# Patient Record
Sex: Male | Born: 1965 | Race: Black or African American | Hispanic: No | Marital: Single | Smoking: Never smoker
Health system: Southern US, Community
[De-identification: ages and names within clinical notes are randomized; demographics above are authoritative.]

## PROBLEM LIST (undated history)

## (undated) ENCOUNTER — Emergency Department (HOSPITAL_COMMUNITY): Discharge status: Absent without leave | Payer: Self-pay

## (undated) DIAGNOSIS — J309 Allergic rhinitis, unspecified: Secondary | ICD-10-CM

## (undated) DIAGNOSIS — R05 Cough: Secondary | ICD-10-CM

## (undated) DIAGNOSIS — I10 Essential (primary) hypertension: Secondary | ICD-10-CM

## (undated) DIAGNOSIS — R053 Chronic cough: Secondary | ICD-10-CM

## (undated) DIAGNOSIS — T7840XA Allergy, unspecified, initial encounter: Secondary | ICD-10-CM

## (undated) DIAGNOSIS — J45909 Unspecified asthma, uncomplicated: Secondary | ICD-10-CM

## (undated) DIAGNOSIS — K5790 Diverticulosis of intestine, part unspecified, without perforation or abscess without bleeding: Secondary | ICD-10-CM

## (undated) HISTORY — DX: Allergy, unspecified, initial encounter: T78.40XA

## (undated) HISTORY — DX: Cough: R05

## (undated) HISTORY — DX: Allergic rhinitis, unspecified: J30.9

## (undated) HISTORY — DX: Unspecified asthma, uncomplicated: J45.909

## (undated) HISTORY — DX: Chronic cough: R05.3

## (undated) HISTORY — PX: EYE SURGERY: SHX253

## (undated) HISTORY — DX: Essential (primary) hypertension: I10

---

## 1998-06-06 ENCOUNTER — Ambulatory Visit (HOSPITAL_COMMUNITY): Admission: RE | Admit: 1998-06-06 | Discharge: 1998-06-06 | Payer: Self-pay | Admitting: Family Medicine

## 2008-01-14 ENCOUNTER — Emergency Department (HOSPITAL_COMMUNITY): Admission: EM | Admit: 2008-01-14 | Discharge: 2008-01-14 | Payer: Self-pay | Admitting: Emergency Medicine

## 2008-11-29 ENCOUNTER — Encounter: Admission: RE | Admit: 2008-11-29 | Discharge: 2008-11-29 | Payer: Self-pay | Admitting: Family Medicine

## 2009-02-27 ENCOUNTER — Encounter: Admission: RE | Admit: 2009-02-27 | Discharge: 2009-02-27 | Payer: Self-pay | Admitting: Family Medicine

## 2010-01-27 DIAGNOSIS — F329 Major depressive disorder, single episode, unspecified: Secondary | ICD-10-CM | POA: Insufficient documentation

## 2010-01-27 DIAGNOSIS — R059 Cough, unspecified: Secondary | ICD-10-CM | POA: Insufficient documentation

## 2010-01-27 DIAGNOSIS — R05 Cough: Secondary | ICD-10-CM | POA: Insufficient documentation

## 2010-01-27 DIAGNOSIS — R079 Chest pain, unspecified: Secondary | ICD-10-CM | POA: Insufficient documentation

## 2010-01-27 DIAGNOSIS — I10 Essential (primary) hypertension: Secondary | ICD-10-CM | POA: Insufficient documentation

## 2010-01-28 ENCOUNTER — Ambulatory Visit: Payer: Self-pay | Admitting: Internal Medicine

## 2010-01-28 LAB — CONVERTED CEMR LAB
Basophils Absolute: 0 10*3/uL (ref 0.0–0.1)
Basophils Relative: 0.6 % (ref 0.0–3.0)
Eosinophils Absolute: 0 10*3/uL (ref 0.0–0.7)
Eosinophils Relative: 0.3 % (ref 0.0–5.0)
HCT: 43.7 % (ref 39.0–52.0)
Hemoglobin: 15.1 g/dL (ref 13.0–17.0)
Lymphocytes Relative: 27.3 % (ref 12.0–46.0)
Lymphs Abs: 2.1 10*3/uL (ref 0.7–4.0)
MCHC: 34.7 g/dL (ref 30.0–36.0)
MCV: 92.7 fL (ref 78.0–100.0)
Monocytes Absolute: 0.4 10*3/uL (ref 0.1–1.0)
Monocytes Relative: 4.8 % (ref 3.0–12.0)
Neutro Abs: 5.2 10*3/uL (ref 1.4–7.7)
Neutrophils Relative %: 67 % (ref 43.0–77.0)
Platelets: 262 10*3/uL (ref 150.0–400.0)
RBC: 4.72 M/uL (ref 4.22–5.81)
RDW: 13.1 % (ref 11.5–14.6)
WBC: 7.8 10*3/uL (ref 4.5–10.5)

## 2010-02-02 LAB — CONVERTED CEMR LAB: IgE (Immunoglobulin E), Serum: 186.1 intl units/mL — ABNORMAL HIGH (ref 0.0–180.0)

## 2010-02-24 ENCOUNTER — Ambulatory Visit: Payer: Self-pay | Admitting: Internal Medicine

## 2010-02-26 ENCOUNTER — Telehealth (INDEPENDENT_AMBULATORY_CARE_PROVIDER_SITE_OTHER): Payer: Self-pay | Admitting: *Deleted

## 2010-02-27 ENCOUNTER — Encounter: Payer: Self-pay | Admitting: Internal Medicine

## 2010-03-19 ENCOUNTER — Ambulatory Visit
Admission: RE | Admit: 2010-03-19 | Discharge: 2010-03-19 | Payer: Self-pay | Source: Home / Self Care | Attending: Internal Medicine | Admitting: Internal Medicine

## 2010-03-20 ENCOUNTER — Ambulatory Visit: Payer: Self-pay | Admitting: Cardiology

## 2010-04-02 ENCOUNTER — Ambulatory Visit
Admission: RE | Admit: 2010-04-02 | Discharge: 2010-04-02 | Payer: Self-pay | Source: Home / Self Care | Attending: Internal Medicine | Admitting: Internal Medicine

## 2010-04-07 ENCOUNTER — Ambulatory Visit (HOSPITAL_COMMUNITY)
Admission: RE | Admit: 2010-04-07 | Discharge: 2010-04-07 | Payer: Self-pay | Source: Home / Self Care | Attending: Internal Medicine | Admitting: Internal Medicine

## 2010-04-07 ENCOUNTER — Encounter: Payer: Self-pay | Admitting: Internal Medicine

## 2010-04-09 ENCOUNTER — Telehealth: Payer: Self-pay | Admitting: Internal Medicine

## 2010-04-09 NOTE — Medication Information (Signed)
Summary: Tax adviser   Imported By: Valinda Hoar 02/27/2010 11:37:06  _____________________________________________________________________  External Attachment:    Type:   Image     Comment:   External Document

## 2010-04-09 NOTE — Progress Notes (Signed)
Summary: PA Nexium > try omeprazole 40 first > ok  Phone Note Other Incoming   Caller: PA Dept [phone] Summary of Call: [facility] [address] [phone]) sent PA for patients Nexium 40mg -qty was in question as well. I spoke with PA dept-further review is needed at this time and should have an answer within 24 hours. Case # [id number] Initial call taken by: Reynaldo Minium CMA,  February 26, 2010 12:10 PM  Follow-up for Phone Call        This has been denied as patient needs to try another PPI first-okay to change to Omeprazole? Thanks.Reynaldo Minium CMA  February 27, 2010 8:47 AM  omeprazole 40 mg Take  one 30-60 min before first meal of the day  Follow-up by: Nyoka Cowden MD,  February 27, 2010 9:27 AM  Additional Follow-up for Phone Call Additional follow up Details #1::        called spoke with patient and advised of MW's recs as stated above.  pt okay with this and verbalized his understanding.  omeprazole sent to verified pharmacy.  pt has an upcoming appt w/ MW 1.19.12 and will keep this. Boone Master CNA/MA  February 27, 2010 9:36 AM     New/Updated Medications: OMEPRAZOLE 40 MG CPDR (OMEPRAZOLE) Take 1 capsule by mouth once a day 30-68minutes before first meal of the day Prescriptions: OMEPRAZOLE 40 MG CPDR (OMEPRAZOLE) Take 1 capsule by mouth once a day 30-62minutes before first meal of the day  #30 x 5   Entered by:   Boone Master CNA/MA   Authorized by:   Nyoka Cowden MD   Signed by:   Boone Master CNA/MA on 02/27/2010   Method used:   Electronically to        [facility] [address] #[id number]* (retail)       [address] [place], [place]  [address]       Ph: [phone]       Fax: [phone]   RxID:   [id number]

## 2010-04-09 NOTE — Assessment & Plan Note (Signed)
Summary: Pulmonary consultation/ cough   Visit Type:  Initial Consult Copy to:  Dr. Tally Joe Primary Provider/Referring Provider:  Dr. Tally Joe  CC:  Cough.  History of Present Illness: 45 [place] with ? EIA outgrew age 45 and never required  treatment  January 28, 2010  1st pulmonary office eval cc new onset  x 6 months indolent onset progressivly worse throat itching assoc with sensation of pnds and eyes watery no better with zyrtec, no better with asthmanex, much better p prednisone for several weeks then recurred, seems worst when lie down , mostly dry minimal mucoid sputum, assoc with mild HB but no overt sinus c/o's.  Pt denies any significant sore throat, dysphagia, itching, sneezing,  nasal congestion or excess secretions,  fever, chills, sweats, unintended wt loss, pleuritic or exertional cp, hempoptysis, change in activity tolerance  orthopnea pnd or leg swelling Pt also denies any obvious fluctuation in symptoms with weather or environmental change or other alleviating or aggravating factors.       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  The patient denies shortness of breath with activity, coughing up blood, chest pain, irregular heartbeats, indigestion, loss of appetite, weight change, difficulty swallowing, sore throat, tooth/dental problems, nasal congestion/difficulty breathing through nose, sneezing, itching, ear ache,  hand/feet swelling, rash, change in color of mucus, and fever.    Vital Signs:  Patient profile:   45 year old male Height:      69 inches Weight:      200.50 pounds BMI:     29.72 O2 Sat:      96 % on Room air Temp:     98.0 degrees F oral Pulse rate:   106 / minute BP sitting:   114 / 88  (right arm)  Vitals Entered By: Vernie Murders (January 28, 2010 10:33 AM)  O2 Flow:  Room air  Physical Exam  Additional Exam:  amb wm with classic voice fatigue and trace pseudowheeze wt 200 January 29, 2010 HEENT: nl dentition, turbinates, and orophanx. Nl external ear canals without cough reflex NECK :  without JVD/Nodes/TM/ nl carotid upstrokes bilaterally LUNGS: no acc muscle use, clear to A and P bilaterally without cough on insp or exp maneuvers CV:  RRR  no s3 or murmur or increase in P2, no edema  ABD:  soft and nontender with nl excursion in the supine position. No bruits or organomegaly, bowel sounds nl MS:  warm without deformities, calf tenderness, cyanosis or clubbing SKIN: warm and dry without lesions   NEURO:  alert, approp, no deficits      White Cell Count          7.8 K/uL                    4.5-10.5   Red Cell Count            4.72 Mil/uL                 4.22-5.81  Hemoglobin                15.1 g/dL                   78.2-95.6   Hematocrit                43.7 %                      39.0-52.0   MCV                       92.7 fl                     78.0-100.0   MCHC                      34.7 g/dL                   21.3-08.6   RDW                       13.1 %                      11.5-14.6   Platelet Count            262.0 K/uL                  150.0-400.0   Neutrophil %              67.0 %                      43.0-77.0   Lymphocyte %              27.3 %                      12.0-46.0   Monocyte %                4.8 %                       3.0-12.0   Eosinophils%              0.3 %                       0.0-5.0   Basophils %               0.6 %                        0.0-3.0   Neutrophill Absolute      5.2 K/uL                    1.4-7.7   Lymphocyte Absolute       2.1 K/uL                    0.7-4.0   Monocyte Absolute         0.4 K/uL                    0.1-1.0  Eosinophils, Absolute                             0.0 K/uL  0.0-0.7   Basophils Absolute        0.0 K/uL                    0.0-0.1   Impression & Recommendations:  Problem # 1:  COUGH (ICD-786.2)  outside films reviewed and are normal IgE 186 profile pending.  Note h/o ? EIA as child and apparent steroid responsiveness now  The most common causes of chronic cough in immunocompetent adults include: upper airway cough syndrome (UACS), previously referred to as postnasal drip syndrome,  caused by variety of rhinosinus conditions; (2) asthma; (3) GERD; (4) chronic bronchitis from cigarette smoking or other inhaled environmental irritants; (5) nonasthmatic eosinophilic bronchitis; and (6) bronchiectasis. These conditions, singly or in combination, have accounted for up to 94% of the causes of chronic cough in prospective studies.   Of the three most common causes of chronic cough, only one GERD can actually cause the other two (asthma and pnds)  and perpetuate the cylce of cough inducing airway trauma, inflammation, heightened sensitivity to reflux which is prompted by the cough itself via a cyclical mechanism.  This may partially respond to steroids and look like asthma and post nasal drainage but never erradicated completely unless the cough and the secondary reflux are eliminated, preferably both at the same time.   If could recurs off suppressants but responds again to prednisone, this limits the ddx to asthma, eos rhinits and bronchitis, and allergic rhinitis/pnds.   The standardized cough guidelines recently published in Chest are a 14 step process, not a single office visit,  and are intended  to address this problem logically,  with an alogrithm dependent on response to each  progressive step  to determine a specific diagnosis with  minimal addtional testing needed. Therefore if compliance is an issue this empiric standardized approach simply won't work.  See instructions for specific recommendations   Medications Added to Medication List This Visit: 1)  Zyrtec Allergy 10 Mg Tabs (Cetirizine hcl) .Marland Kitchen.. 1 once daily 2)  Hydrochlorothiazide 25 Mg Tabs (Hydrochlorothiazide) .Marland Kitchen.. 1 once daily 3)  Nexium 40 Mg Cpdr (Esomeprazole magnesium) .Marland Kitchen.. 1 every am 4)  Nexium 40 Mg Cpdr (Esomeprazole magnesium) .... Take  one 30-60 min before first meal of the day 5)  Pepcid 20 Mg Tabs (Famotidine) .... Take one by mouth at bedtime 6)  Prednisone 10 Mg Tabs (Prednisone) .... 4 each am x 2days, 2x2days, 1x2days and stop  Other Orders: T-Allergy Profile Region [place], [place], [place], [place], [place] (5484) TLB-CBC Platelet - w/Differential (85025-CBCD) Consultation Level V [phone])  Patient Instructions: 1)  stop zyrtec 2)  Change nexium Take  one 30-60 min before first meal of the day and add pepcid 20 mg one at bedtime 3)  Prednisone 4 each am x 2days, 2x2days, 1x2days and stop 4)  if itching sneezing or runny nose,  scratchy throat take chlortrimeton 4 mg every 6 hours as needed 5)  for cough try delsym 2 tsp every 12 hours as needed 6)  GERD (REFLUX)  is a common cause of respiratory symptoms. It commonly presents without heartburn and can be treated with medication, but also with lifestyle changes including avoidance of late meals, excessive alcohol, smoking cessation, and avoid fatty foods, chocolate, peppermint, colas, red wine, and acidic juices such as orange juice. NO MINT OR MENTHOL PRODUCTS SO NO COUGH DROPS  7)  USE SUGARLESS CANDY INSTEAD (jolley ranchers)  8)  NO OIL BASED VITAMINS  9)  Please schedule a  follow-up appointment in 4 weeks, sooner if needed  Prescriptions: PREDNISONE 10 MG  TABS (PREDNISONE) 4 each am x 2days, 2x2days, 1x2days and stop  #14 x 0   Entered and  Authorized by:   Nyoka Cowden MD   Signed by:   Nyoka Cowden MD on 01/28/2010   Method used:   Electronically to        [facility] [address] [facility] [address] #[id number]* (retail)       [address] [place], [place]  [address]       Ph: [phone]       Fax: [phone]   RxID:   [id number]

## 2010-04-09 NOTE — Assessment & Plan Note (Signed)
Summary: Pulmonary/ ext f/u ov for cough flared off Nexium > restart   Copy to:  Dr. Tally Joe Primary Provider/Referring Provider:  Dr. Tally Joe  CC:  Cough- some better.  History of Present Illness: 16 yobm with ? EIA outgrew age 45 and never required  treatment  January 28, 2010  1st pulmonary office eval cc new onset  x 6 months indolent onset progressivly worse throat itching assoc with sensation of pnds and eyes watery no better with zyrtec, no better with asthmanex, much better p prednisone for several weeks then recurred, seems worst when lie down , mostly dry minimal mucoid sputum, assoc with mild HB but no overt sinus c/o's. w/u with pos allergy screen with IgE 186, imp was uacs/pnds  rec stop zyrtec Change nexium Take  one 30-60 min before first meal of the day and add pepcid 20 mg one at bedtime Prednisone 4 each am x 2days, 2x2days, 1x2days and stop if itching sneezing or runny nose,  scratchy throat take chlortrimeton 4 mg every 6 hours as needed for cough try delsym 2 tsp every 12 hours as needed  February 24, 2010 ov cough better on rx then worse once ran out nexium despite continuing hs h2 and h1 per guidelines.  Pt denies any significant sore throat, dysphagia, itching, sneezing,  nasal congestion or excess secretions,  fever, chills, sweats, unintended wt loss, pleuritic or exertional cp, hempoptysis, change in activity tolerance  orthopnea pnd or leg swelling.  Pt also denies any obvious fluctuation in symptoms with weather or environmental change or other alleviating or aggravating factors.            Current Medications (verified): 1)  Hydrochlorothiazide 25 Mg Tabs (Hydrochlorothiazide) .Marland Kitchen.. 1 Once Daily 2)  Nexium 40 Mg Cpdr (Esomeprazole Magnesium) .... Take  One 30-60 Min Before First Meal of The Day- Ran Out 3)  Pepcid 20 Mg Tabs (Famotidine) .... Take One By Mouth At Bedtime  Allergies (verified): No Known Drug Allergies  Past History:  Past  Medical History: Chronic cough ..........................Marland KitchenWert     - Allergy profile Jan 28 2010 IgE 186 pos to grass/mold  Vital Signs:  Patient profile:   45 year old male Weight:      209 pounds O2 Sat:      98 % on Room air Temp:     97.5 degrees F oral Pulse rate:   54 / minute BP sitting:   134 / 82  (right arm) Cuff size:   large  Vitals Entered By: Vernie Murders (February 24, 2010 11:21 AM)  O2 Flow:  Room air  Physical Exam  Additional Exam:  amb wm with classic voice fatigue and freq throat clearing. with mint gum in mouth wt 200 January 29, 2010 209 February 24, 2010  HEENT: nl dentition, turbinates, and orophanx. Nl external ear canals without cough reflex NECK :  without JVD/Nodes/TM/ nl carotid upstrokes bilaterally LUNGS: no acc muscle use, clear to A and P bilaterally without cough on insp or exp maneuvers CV:  RRR  no s3 or murmur or increase in P2, no edema  ABD:  soft and nontender with nl excursion in the supine position. No bruits or organomegaly, bowel sounds nl MS:  warm without deformities, calf tenderness, cyanosis or clubbing       Impression & Recommendations:  Problem # 1:  COUGH (ICD-786.2)   Note h/o ? EIA as child and apparent steroid responsiveness now  The most common causes of chronic  cough in immunocompetent adults include: upper airway cough syndrome (UACS), previously referred to as postnasal drip syndrome,  caused by variety of rhinosinus conditions; (2) asthma; (3) GERD; (4) chronic bronchitis from cigarette smoking or other inhaled environmental irritants; (5) nonasthmatic eosinophilic bronchitis; and (6) bronchiectasis. These conditions, singly or in combination, have accounted for up to 94% of the causes of chronic cough in prospective studies.    since cough  recurred  off suppressants but responds again to prednisone, this limits the ddx to asthma, eos rhinits and bronchitis, and allergic rhinitis/pnds but note he also stopped  nexium prematurely and reflux is to cough what oxygen is to fire  I had an extended discussion with the patient today lasting 15 to 20 minutes of a 25 minute visit on the following issues:   The standardized cough guidelines recently published in Chest are a 14 step process, not a single office visit,  and are intended  to address this problem logically,  with an alogrithm dependent on response to each progressive step  to determine a specific diagnosis with  minimal addtional testing needed. Therefore if compliance is an issue this empiric standardized approach simply won't work.  See instructions for specific recommendations   Orders: Est. Patient Level IV ([address])  Medications Added to Medication List This Visit: 1)  Nexium 40 Mg Cpdr (Esomeprazole magnesium) .... Take  one 30-60 min before first meal of the day- ran out 2)  Nexium 40 Mg Cpdr (Esomeprazole magnesium) .... Take  one 30-60 min before first meal of the day 3)  Prednisone 10 Mg Tabs (Prednisone) .... 4 each am x 2days, 2x2days, 1x2days and stop  Patient Instructions: 1)  GERD (REFLUX)  is a common cause of respiratory symptoms. It commonly presents without heartburn and can be treated with medication, but also with lifestyle changes including avoidance of late meals, excessive alcohol, smoking cessation, and avoid fatty foods, chocolate, peppermint,  or mint in any form, colas, red wine, and acidic juices such as orange juice. NO MINT OR MENTHOL PRODUCTS SO NO COUGH DROPS  2)  USE SUGARLESS CANDY INSTEAD (jolley ranchers)  3)  NO OIL BASED VITAMINS  4)  Resume nexium 40 mg Take  one 30-60 min before first meal of the day  (equivalent of prilosec otc 20 mg x 2) 5)  Prednisone 10 mg 4 each am x 2days, 2x2days, 1x2days and stop  6)  Please schedule a follow-up appointment in 4 weeks, sooner if needed  Prescriptions: PREDNISONE 10 MG  TABS (PREDNISONE) 4 each am x 2days, 2x2days, 1x2days and stop  #14 x 0   Entered and Authorized  by:   Nyoka Cowden MD   Signed by:   Nyoka Cowden MD on 02/24/2010   Method used:   Electronically to        [facility] [address] #[id number]* (retail)       [address] [place], [place]  [address]       Ph: [phone]       Fax: [phone]   RxID:   [id number] NEXIUM 40 MG CPDR (ESOMEPRAZOLE MAGNESIUM) Take  one 30-60 min before first meal of the day  #34 x 11   Entered and Authorized by:   Nyoka Cowden MD   Signed by:   Nyoka Cowden MD on 02/24/2010   Method used:   Electronically to        [facility] [address] #[id number]* (  retail)       [address] [place], [place]  [address]       Ph: [phone]       Fax: [phone]   RxID:   [id number]

## 2010-04-09 NOTE — Assessment & Plan Note (Signed)
Summary: Pulmomonary/ext ov > sinus ct next   Copy to:  Dr. Tally Joe Primary Provider/Referring Provider:  Dr. Tally Joe  CC:  Cough- worse.  History of Present Illness: 51 yobm with ? EIA outgrew age 45 and never required  treatment  January 28, 2010  1st pulmonary office eval cc new onset  x 6 months indolent onset progressivly worse throat itching assoc with sensation of pnds and eyes watery no better with zyrtec, no better with asthmanex, much better p prednisone for several weeks then recurred, seems worst when lie down , mostly dry minimal mucoid sputum, assoc with mild HB but no overt sinus c/o's. w/u with pos allergy screen with IgE 186, imp was uacs/pnds  rec stop zyrtec Change nexium Take  one 30-60 min before first meal of the day and add pepcid 20 mg one at bedtime Prednisone 4 each am x 2days, 2x2days, 1x2days and stop if itching sneezing or runny nose,  scratchy throat take chlortrimeton 4 mg every 6 hours as needed for cough try delsym 2 tsp every 12 hours as needed  February 24, 2010 ov cough better on rx then worse once ran out nexium despite continuing hs h2 and h1 per guidelines.  GERD (REFLUX) diet Resume nexium 40 mg Take  one 30-60 min before first meal of the day  (equivalent of prilosec otc 20 mg x 2) Prednisone 10 mg 4 each am x 2days, 2x2days, 1x2days and stop  No better at all but thinks caught cold over Christmas ended up in UC 03/08/10  with cough yellow  mucus dx of sinus dx treated with amoxicillin and hydromet >  no better  March 19, 2010 ov cough is worse esp when lie down at night despite taking omeprazole 40 mg one in am and pepcid 20 mg one at bedtime, never controlled even with tramadol. no purulent sputum or sob. Pt denies any significant sore throat, dysphagia, itching, sneezing,  nasal congestion or excess secretions,  fever, chills, sweats, unintended wt loss, pleuritic or exertional cp, hempoptysis, change in activity tolerance  orthopnea pnd  or leg swelling. Pt also denies any obvious fluctuation in symptoms with weather or environmental change or other alleviating or aggravating factors.             Current Medications (verified): 1)  Hydrochlorothiazide 25 Mg Tabs (Hydrochlorothiazide) .Marland Kitchen.. 1 Once Daily 2)  Pepcid 20 Mg Tabs (Famotidine) .... Take One By Mouth At Bedtime 3)  Omeprazole 40 Mg Cpdr (Omeprazole) .... Take 1 Capsule By Mouth Once A Day 30-54minutes Before First Meal of The Day 4)  Hydromet 5-1.5 Mg/17ml Syrp (Hydrocodone-Homatropine) .Marland Kitchen.. 1 Tsp Every 4-6 Hrs As Needed  Allergies (verified): No Known Drug Allergies  Past History:  Past Medical History: Chronic cough ..........................Marland KitchenWert     - Allergy profile Jan 28 2010 IgE 186 pos to grass/mold only     - Sinus CT March 19, 2010 >>>  Vital Signs:  Patient profile:   45 year old male Weight:      212 pounds O2 Sat:      99 % on Room air Temp:     98.5 degrees F oral Pulse rate:   60 / minute BP sitting:   110 / 80  (left arm) Cuff size:   large  Vitals Entered By: Vernie Murders (March 19, 2010 9:53 AM)  O2 Flow:  Room air  Physical Exam  Additional Exam:  amb wm with somewhat of a hopeless affect  wt  200 January 29, 2010 >  209 February 24, 2010 > 212 March 19, 2010  HEENT: nl dentition, turbinates, and orophanx. Nl external ear canals without cough reflex NECK :  without JVD/Nodes/TM/ nl carotid upstrokes bilaterally LUNGS: no acc muscle use, clear to A and P bilaterally without cough on insp or exp maneuvers CV:  RRR  no s3 or murmur or increase in P2, no edema  ABD:  soft and nontender with nl excursion in the supine position. No bruits or organomegaly, bowel sounds nl MS:  warm without deformities, calf tenderness, cyanosis or clubbing       Impression & Recommendations:  Problem # 1:  COUGH (ICD-786.2)  Note h/o ? EIA as child and apparent partial steroid responsiveness now  The most common causes of chronic  cough in immunocompetent adults include: upper airway cough syndrome (UACS), previously referred to as postnasal drip syndrome,  caused by variety of rhinosinus conditions; (2) asthma; (3) GERD; (4) chronic bronchitis from cigarette smoking or other inhaled environmental irritants; (5) nonasthmatic eosinophilic bronchitis; and (6) bronchiectasis. These conditions, singly or in combination, have accounted for up to 94% of the causes of chronic cough in prospective studies.   Of the three most common causes of chronic cough, only one can (GERD) actually aggravate or enhance  the other two and perpetuate the cylce of cough inducing airway trauma, inflammation, heightened sensitivity to reflux which is prompted by the cough itself via a cyclical mechanism.  This may partially respond to steroids and look like asthma and post nasal drainage but never erradicated completely unless the cough and the secondary reflux are eliminated, preferably both at the same time.   See instructions for specific recommendations - add 1st gen H1 per guidelines and check sinus ct .   Each maintenance medication was reviewed in detail including most importantly the difference between maintenance and as needed and under what circumstances the prns are to be used. need to use a trust but verify approach to make sure he is using meds correctly before starting on additional empiric trials. See instructions for specific recommendations   Medications Added to Medication List This Visit: 1)  Hydromet 5-1.5 Mg/22ml Syrp (Hydrocodone-homatropine) .Marland Kitchen.. 1 tsp every 4-6 hrs as needed 2)  Tramadol Hcl 50 Mg Tabs (Tramadol hcl) .... One to two by mouth every 4-6 hours as needed for coughing 3)  Prednisone 10 Mg Tabs (Prednisone) .... 4 each am x 2 days,  3 x 2days, 2x2 days, and 1x2 days  Other Orders: Misc. Referral (Misc. Ref) Est. Patient Level IV ([id number])  Patient Instructions: 1)  GERD (REFLUX)  is a common cause of respiratory  symptoms. It commonly presents without heartburn and can be treated with medication, but also with lifestyle changes including avoidance of late meals, excessive alcohol, smoking cessation, and avoid fatty foods, chocolate, peppermint, colas, red wine, and acidic juices such as orange juice. NO MINT OR MENTHOL PRODUCTS SO NO COUGH DROPS  2)  USE SUGARLESS CANDY INSTEAD (jolley ranchers)  3)  NO OIL BASED VITAMINS  4)  Prednisone 4 each am x 2 days,  3 x 2days, 2x2 days, and 1x2 days 5)  Take delsym two tsp every 12 hours and add tramadol 50 mg up to every 4 hours to suppress the urge to cough. Swallowing water or using ice chips/non mint and menthol containing candies (such as lifesavers or sugarless jolly ranchers) are also effective.  6)  Chlortrimeton 4 mg one at bedtime and as  needed during the day for itching scratchy throat. 7)  See Patient Care Coordinator before leaving for sinus CT 8)  Please schedule a follow-up appointment in 2 weeks, sooner if needed with all your active  medcations in hand  Prescriptions: PREDNISONE 10 MG  TABS (PREDNISONE) 4 each am x 2 days,  3 x 2days, 2x2 days, and 1x2 days  #20 x 0   Entered and Authorized by:   Nyoka Cowden MD   Signed by:   Nyoka Cowden MD on 03/19/2010   Method used:   Electronically to        [facility] [address] [id number]* (retail)       [address] [place], [place]  [address]       Ph: [phone]       Fax: [phone]   RxID:   [id number] TRAMADOL HCL 50 MG  TABS (TRAMADOL HCL) One to two by mouth every 4-6 hours as needed for coughing  #40 x 0   Entered and Authorized by:   Nyoka Cowden MD   Signed by:   Nyoka Cowden MD on 03/19/2010   Method used:   Electronically to        [facility] [address] [id number]* (retail)       [address] [place], [place]  [address]       Ph: [phone]       Fax: [phone]   RxID:   [id number]

## 2010-04-09 NOTE — Assessment & Plan Note (Signed)
Summary: Pulmonary/ ext summary ov, better  still some cough > MCT next   Copy to:  Dr. Tally Joe Primary Provider/Referring Provider:  Dr. Tally Joe  CC:  2 wk. f/u-cough-better, prod. yellow in am, clear in pm, and no sob or wheezing.  History of Present Illness: 45 yobm with ? EIA outgrew age 45 and never required  treatment  January 28, 2010  1st pulmonary office eval cc new onset  x 6 months indolent onset progressivly worse throat itching assoc with sensation of pnds and eyes watery no better with zyrtec, no better with asthmanex, much better p prednisone for several weeks then recurred, seems worst when lie down , mostly dry minimal mucoid sputum, assoc with mild HB but no overt sinus c/o's. w/u with pos allergy screen with IgE 186, imp was uacs/pnds  rec stop zyrtec Change nexium Take  one 30-60 min before first meal of the day and add pepcid 20 mg one at bedtime Prednisone 4 each am x 2days, 2x2days, 1x2days and stop if itching sneezing or runny nose,  scratchy throat take chlortrimeton 4 mg every 6 hours as needed for cough try delsym 2 tsp every 12 hours as needed  February 24, 2010 ov cough better on rx then worse once ran out nexium despite continuing hs h2 and h1 per guidelines.  GERD (REFLUX) diet Resume nexium 40 mg Take  one 30-60 min before first meal of the day  (equivalent of prilosec otc 20 mg x 2) Prednisone 10 mg 4 each am x 2days, 2x2days, 1x2days and stop  No better at all but thinks caught cold over Christmas ended up in UC 03/08/10  with cough yellow  mucus dx of sinus dx treated with amoxicillin and hydromet >  no better  March 19, 2010 ov cough is worse esp when lie down at night despite taking omeprazole 40 mg one in am and pepcid 20 mg one at bedtime, never controlled even with tramadol. rec GERD (REFLUX) diet  Prednisone 4 each am x 2 days,  3 x 2days, 2x2 days, and 1x2 days Take delsym two tsp every 12 hours and add tramadol 50 mg up to every 4 hours  to suppress the urge to cough. Swallowing water or using ice chips/non mint and menthol containing candies (such as lifesavers or sugarless jolly ranchers) are also effective.  Chlortrimeton 4 mg one at bedtime and as needed during the day for itching scratchy throat. Sinus ct > neg    see page 2     April 02, 2010 2 wk. f/u-cough-better,prod. yellow in am,clear in pm,no sob or wheezing, no noct disturbance, no longer using tramadol, did not bring in the pepcid but has everything else in hand including chlortrimeton, had not taken by hour of ov and freq throat clearing noted during the interview with some sniffling.  Pt denies any significant sore throat, dysphagia, itching, sneezing,  nasal congestion or excess secretions,  fever, chills, sweats, unintended wt loss, pleuritic or exertional cp, hempoptysis, change in activity tolerance  orthopnea pnd or leg swelling Pt also denies any obvious fluctuation in symptoms with weather or environmental change or other alleviating or aggravating factors.       Preventive Screening-Counseling & Management  Alcohol-Tobacco     Smoking Status: never  Current Medications (verified): 1)  Hydrochlorothiazide 25 Mg Tabs (Hydrochlorothiazide) .Marland Kitchen.. 1 Once Daily 2)  Pepcid 20 Mg Tabs (Famotidine) .... Take One By Mouth At Bedtime 3)  Omeprazole 40 Mg Cpdr (Omeprazole) .Marland KitchenMarland KitchenMarland Kitchen  Take 1 Capsule By Mouth Once A Day 30-56minutes Before First Meal of The Day 4)  Tramadol Hcl 50 Mg  Tabs (Tramadol Hcl) .... 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 ETOH occ  Risk  Factors: Smoking Status: never (04/02/2010)  Past Medical History: Chronic cough ..........................Marland KitchenWert     - Allergy profile Jan 28 2010 IgE 186 pos to grass/mold only     - Sinus CT March 19, 2010 >>> neg     - Methacholine challenge test scheduled 04/02/10>>>  Family History: Heart dz- Father and Mother (both had stroke) DVT- Father Sarcoid- Mother Negative for respiratory diseases or atopy  Brother and father have issues with freq sniffling/ coughing  Social History: Smoking Status:  never  Vital Signs:  Patient profile:   45 year old male Height:      69 inches Weight:      209.25 pounds O2 Sat:      96 % Temp:     98.3 degrees F oral Pulse rate:   80 / minute BP sitting:   122 / 78  (left arm) Cuff size:   large  Vitals Entered By: Arlyce Dice  CC: 2 wk. f/u-cough-better,prod. yellow in am,clear in pm,no sob or wheezing Is Patient Diabetic? No Comments Medications reviewed wi th patient ,phone # verfied. Vernie Murders  April 02, 2010 9:06 AM    Physical Exam  Additional Exam:  amb wm with affect brighter, still freq throat clearing wt 200 January 29, 2010 >  209 February 24, 2010 > 212 March 19, 2010 > 209 April 04, 2010  HEENT: nl dentition, turbinates, and orophanx is pristine. Nl external ear canals without cough reflex NECK :  without JVD/Nodes/TM/ nl carotid upstrokes bilaterally LUNGS: no acc muscle use, clear to A and P bilaterally without cough on insp or exp maneuvers CV:  RRR  no s3 or murmur or increase in P2, no edema  ABD:  soft and nontender with nl excursion in the supine position. No bruits or organomegaly, bowel sounds nl MS:  warm without deformities, calf tenderness, cyanosis or clubbing       Impression & Recommendations:  Problem # 1:  COUGH (ICD-786.2)   Note h/o ? EIA as child and apparent partial steroid responsiveness now > methacholine challenge next  The most common causes of chronic cough in  immunocompetent adults include: upper airway cough syndrome (UACS), previously referred to as postnasal drip syndrome,  caused by variety of rhinosinus conditions; (2) asthma; (3) GERD; (4) chronic bronchitis from cigarette smoking or other inhaled environmental irritants; (5) nonasthmatic eosinophilic bronchitis; and (6) bronchiectasis. These conditions, singly or in combination, have accounted for up to 94% of the causes of chronic cough in prospective studies.   Of the three most common causes of chronic cough, only one can (GERD) actually aggravate or enhance  the other two and perpetuate the cylce of cough inducing airway trauma, inflammation, heightened sensitivity to reflux which is prompted by the cough itself via a cyclical mechanism.  This may partially respond to steroids and look like asthma and post nasal drainage but never erradicated completely unless the cough and  the secondary reflux are eliminated, preferably both at the same time.   See instructions for specific recommendations - add 1st gen H1 per guidelines  Each maintenance medication was reviewed in detail including most importantly the difference between maintenance and as needed and under what circumstances the prns are to be used. need to use a trust but verify approach to make sure he is using meds correctly before starting on additional empiric trials. See instructions for specific recommendations   Other Orders: Misc. Referral (Misc. Ref) Est. Patient Level III (56213)  Patient Instructions: 1)  See Patient Care Coordinator before leaving for methacholine challenge test 2)  Take delsym two tsp every 12 hours and add tramadol 50 mg up to every 4 hours if needed  to suppress the urge to cough. Swallowing water or using ice chips/non mint and menthol containing candies (such as lifesavers or sugarless jolly ranchers) are also effective.  3)  Always bring all active medications with you to the office    Immunization  History:  Influenza Immunization History:    Influenza:  historical (12/06/2009)

## 2010-04-10 ENCOUNTER — Telehealth: Payer: Self-pay | Admitting: Internal Medicine

## 2010-04-15 NOTE — Progress Notes (Signed)
Summary: returned call-no record of anyone calling  Phone Note Call from Patient Call back at Home Phone [phone]   Caller: Patient Call For: Sherran Margolis Summary of Call: pt returned call. (he says the nurse may have called him re: recent meth.challenge test.  Initial call taken by: Tivis Ringer, CNA,  April 09, 2010 9:32 AM  Follow-up for Phone Call        Advised pt no record of anyone calling from this office. I advised it takes 2 weeks to get MCT results and that we will call once they are reviewed. Carron Curie CMA  April 09, 2010 10:07 AM

## 2010-04-17 ENCOUNTER — Encounter: Payer: Self-pay | Admitting: Internal Medicine

## 2010-04-17 ENCOUNTER — Ambulatory Visit (INDEPENDENT_AMBULATORY_CARE_PROVIDER_SITE_OTHER): Payer: 59 | Admitting: Internal Medicine

## 2010-04-17 DIAGNOSIS — R059 Cough, unspecified: Secondary | ICD-10-CM

## 2010-04-17 DIAGNOSIS — R05 Cough: Secondary | ICD-10-CM

## 2010-04-23 NOTE — Assessment & Plan Note (Signed)
Summary: Pulmonary/ ext ov for cough variant asthma/ start singulair   Copy to:  Dr. Tally Joe Primary Provider/Referring Provider:  Dr. Tally Joe  CC:  Cough- improved some.  History of Present Illness: 44 yobm with ? EIA outgrew age 45 and never required  treatment but cough since 07/2009 with POSITIVE methacholine challenge 03/2010 c/w cough variant asthma.  January 28, 2010  1st pulmonary office eval cc cough = new onset  x 6 months indolent onset progressivly worse throat itching assoc with sensation of pnds and eyes watery no better with zyrtec, no better with asthmanex, much better p prednisone for several weeks then recurred, seems worst when lie down , mostly dry minimal mucoid sputum, assoc with mild HB but no overt sinus c/o's. w/u with pos allergy screen with IgE 186 grass/ mold only  imp was uacs/pnds  rec stop zyrtec Change nexium Take  one 30-60 min before first meal of the day and add pepcid 20 mg one at bedtime Prednisone 4 each am x 2days, 2x2days, 1x2days and stop if itching sneezing or runny nose,  scratchy throat take chlortrimeton 4 mg every 6 hours as needed for cough try delsym 2 tsp every 12 hours as needed  February 24, 2010 ov cough better on rx then worse once ran out nexium despite continuing hs h2 and h1 per guidelines.  GERD (REFLUX) diet Resume nexium 40 mg Take  one 30-60 min before first meal of the day  (equivalent of prilosec otc 20 mg x 2) Prednisone 10 mg 4 each am x 2days, 2x2days, 1x2days and stop  No better at all but thinks caught cold over Christmas ended up in UC 03/08/10  with cough yellow  mucus dx of sinus dx treated with amoxicillin and hydromet >  no better  March 19, 2010 ov cough is worse esp when lie down at night despite taking omeprazole 40 mg one in am and pepcid 20 mg one at bedtime, never controlled even with tramadol. rec GERD (REFLUX) diet  Prednisone 4 each am x 2 days,  3 x 2days, 2x2 days, and 1x2 days Take delsym two tsp  every 12 hours and add tramadol 50 mg up to every 4 hours to suppress the urge to cough.  Sinus ct > neg    see page 2      April 02, 2010 2 wk. f/u-cough-better,prod. yellow in am,clear in pm,no sob or wheezing, no noct disturbance, no longer using tramadol, did not bring in the pepcid but has everything else in hand including chlortrimeton, had not taken by hour of ov and freq throat clearing noted during the interview with some sniffling.  See Patient Care Coordinator before leaving for methacholine challenge test Take delsym two tsp every 12 hours and add tramadol 50 mg up to every 4 hours if needed  to suppress the urge to cough. Swallowing water or using ice chips/non mint and menthol containing candies (such as lifesavers or sugarless jolly ranchers) are also effective.  Always bring all active medications with you to the office   1/31/ 2012 methacholine POSITIVE  at second dose but no symptoms of cough/ tightness  April 17, 2010 ov better to his satisfaction but still gets tickle in throat requiring chortrimeton and tramdol daily. no excess mucus, no sob. Pt denies any significant sore throat, dysphagia, itching, sneezing,  nasal congestion or excess secretions,  fever, chills, sweats, unintended wt loss, pleuritic or exertional cp, hempoptysis, change in activity tolerance  orthopnea pnd  or leg swelling Pt also denies any obvious fluctuation in symptoms with weather or environmental change or other alleviating or aggravating factors.        Current Medications (verified): 1)  Hydrochlorothiazide 25 Mg Tabs (Hydrochlorothiazide) .Marland Kitchen.. 1 Once Daily 2)  Pepcid 20 Mg Tabs (Famotidine) .... Take One By Mouth At Bedtime 3)  Omeprazole 40 Mg Cpdr (Omeprazole) .... Take 1 Capsule By Mouth Once A Day 30-55minutes Before First Meal of The Day 4)  Tramadol Hcl 50 Mg  Tabs (Tramadol Hcl) .... One To Two By Mouth Every 4-6 Hours As Needed For Coughing 5)  Chlorpheniramine Maleate 4 Mg Tabs  (Chlorpheniramine Maleate) .Marland Kitchen.. 1 At Bedtime As Needed and Every 4 Hrs During The Day As Needed  Allergies (verified): No Known Drug Allergies  Past History:  Past Medical History: Chronic cough ..........................Marland KitchenWert     - Allergy profile Jan 28 2010 IgE 186 pos to grass/mold only     - Sinus CT March 19, 2010 >>> neg     - Methacholine challenge test  04/02/10> POSITIVE at 2nd dose but no symptoms  Vital Signs:  Patient profile:   45 year old male Weight:      214 pounds O2 Sat:      98 % on Room air Temp:     97.6 degrees F oral Pulse rate:   80 / minute BP sitting:   112 / 78  (left arm) Cuff size:   large  Vitals Entered By: Vernie Murders (April 17, 2010 2:49 PM)  O2 Flow:  Room air  Physical Exam  Additional Exam:  amb wm with affect brighter, still occ  throat clearing wt 200 January 29, 2010 > 209 April 04, 2010 > 214 April 17, 2010  HEENT: nl dentition, turbinates, and orophanx is pristine. Nl external ear canals without cough reflex NECK :  without JVD/Nodes/TM/ nl carotid upstrokes bilaterally LUNGS: no acc muscle use, clear to A and P bilaterally without cough on insp or exp maneuvers CV:  RRR  no s3 or murmur or increase in P2, no edema  ABD:  soft and nontender with nl excursion in the supine position. No bruits or organomegaly, bowel sounds nl MS:  warm without deformities, calf tenderness, cyanosis or clubbing       Impression & Recommendations:  Problem # 1:  COUGH (ICD-786.2) Note h/o ? EIA as child and apparent partial steroid responsiveness now > methacholine challenge POSITIVE   The most common causes of chronic cough in immunocompetent adults include: upper airway cough syndrome (UACS), previously referred to as postnasal drip syndrome,  caused by variety of rhinosinus conditions; (2) asthma; (3) GERD; (4) chronic bronchitis from cigarette smoking or other inhaled environmental irritants; (5) nonasthmatic eosinophilic bronchitis;  and (6) bronchiectasis. These conditions, singly or in combination, have accounted for up to 94% of the causes of chronic cough in prospective studies.    Most likely this is cough variant asthma, though note no correlation between drop in fev1 on MCT vs symptoms which continue to be largely upper resp > try singulair first, then consider qvar  Explained many pts with cough have more than one trigger(and note no cough provoked on MCT)  so no change in other meds until cough 100% controlled  Medications Added to Medication List This Visit: 1)  Chlorpheniramine Maleate 4 Mg Tabs (Chlorpheniramine maleate) .Marland Kitchen.. 1 at bedtime as needed and every 4 hrs during the day as needed 2)  Singulair 10 Mg Tabs (Montelukast  sodium) .... One by mouth each pm  Other Orders: Est. Patient Level IV (16109)  Patient Instructions: 1)  Singulair 10 mg one each  and every pm to see if cough / drainage  resolves but continue all the other medications the same 2)  Please schedule a follow-up appointment in 4 weeks, sooner if needed

## 2010-04-23 NOTE — Progress Notes (Signed)
Summary: positive MCT- LMTCB x 2  Phone Note Outgoing Call   Call placed by: Vernie Murders,  April 10, 2010 10:30 AM Call placed to: Patient Summary of Call: Per MW, MCT was positive for asthma, so pt needs appt to discuss further options. Called pt and LMOMTCB. Initial call taken by: Vernie Murders,  April 10, 2010 10:31 AM  Follow-up for Phone Call        [facility] Vernie Murders  April 14, 2010 4:15 PM  Spoke wit pt and notified of results/recs per MW.  Pt verbalized understanding and appt sched for 04/17/10 at 2:45 pm. Follow-up by: Vernie Murders,  April 14, 2010 5:16 PM

## 2010-05-15 ENCOUNTER — Ambulatory Visit (INDEPENDENT_AMBULATORY_CARE_PROVIDER_SITE_OTHER): Payer: 59 | Admitting: Internal Medicine

## 2010-05-15 ENCOUNTER — Encounter: Payer: Self-pay | Admitting: Internal Medicine

## 2010-05-15 DIAGNOSIS — J45909 Unspecified asthma, uncomplicated: Secondary | ICD-10-CM

## 2010-05-15 DIAGNOSIS — R059 Cough, unspecified: Secondary | ICD-10-CM

## 2010-05-15 DIAGNOSIS — R05 Cough: Secondary | ICD-10-CM

## 2010-05-26 NOTE — Assessment & Plan Note (Signed)
Summary: Pulmonary  ext f/u ov failed singulair, try qvar 50% hfa   Copy to:  Dr. Tally Joe Primary Provider/Referring Provider:  Dr. Tally Joe  CC:  Cough- worse at hs.  History of Present Illness: 40 yobm with ? EIA outgrew age 45 and never required  treatment but cough since 07/2009 with POSITIVE methacholine challenge 03/2010 c/w cough variant asthma.  January 28, 2010  1st pulmonary office eval cc cough = new onset  x 6 months indolent onset progressivly worse throat itching assoc with sensation of pnds and eyes watery no better with zyrtec, no better with asthmanex, much better p prednisone for several weeks then recurred, seems worst when lie down , mostly dry minimal mucoid sputum, assoc with mild HB but no overt sinus c/o's. w/u with pos allergy screen with IgE 186 grass/ mold only  imp was uacs/pnds  rec stop zyrtec Change nexium Take  one 30-60 min before first meal of the day and add pepcid 20 mg one at bedtime Prednisone 4 each am x 2days, 2x2days, 1x2days and stop if itching sneezing or runny nose,  scratchy throat take chlortrimeton 4 mg every 6 hours as needed for cough try delsym 2 tsp every 12 hours as needed  February 24, 2010 ov cough better on rx then worse once ran out nexium despite continuing hs h2 and h1 per guidelines.  GERD (REFLUX) diet Resume nexium 40 mg Take  one 30-60 min before first meal of the day  (equivalent of prilosec otc 20 mg x 2) Prednisone 10 mg 4 each am x 2days, 2x2days, 1x2days and stop  No better at all but thinks caught cold over Christmas ended up in UC 03/08/10  with cough yellow  mucus dx of sinus dx treated with amoxicillin and hydromet >  no better  March 19, 2010 ov cough is worse esp when lie down at night despite taking omeprazole 40 mg one in am and pepcid 20 mg one at bedtime, never controlled even with tramadol. rec GERD (REFLUX) diet  Prednisone 4 each am x 2 days,  3 x 2days, 2x2 days, and 1x2 days Take delsym two tsp every  12 hours and add tramadol 50 mg up to every 4 hours to suppress the urge to cough.  Sinus ct > neg    see page 2      April 02, 2010 2 wk. f/u-cough-better,prod. yellow in am,clear in pm,no sob or wheezing, no noct disturbance, no longer using tramadol, did not bring in the pepcid but has everything else in hand including chlortrimeton, had not taken by hour of ov and freq throat clearing noted during the interview with some sniffling.  See Patient Care Coordinator before leaving for methacholine challenge test Take delsym two tsp every 12 hours and add tramadol 50 mg up to every 4 hours if needed  to suppress the urge to cough. Swallowing water or using ice chips/non mint and menthol containing candies (such as lifesavers or sugarless jolly ranchers) are also effective.  Always bring all active medications with you to the office   1/31/ 2012 methacholine POSITIVE  at second dose but no symptoms of cough/ tightness  April 17, 2010 ov better to his satisfaction but still gets tickle in throat requiring chortrimeton and tramdol daily. no excess mucus, no sob. rec Singulair 10 mg one each  and every pm to see if cough / drainage  resolves but continue all the other medications the same  May 15, 2010  ov  cc cough  no better with singulair esp at hs.  pnds better with h1 but not helping cough.  no sign sob.  Pt denies any significant sore throat, dysphagia, itching, sneezing,  nasal congestion or excess secretions,  fever, chills, sweats, unintended wt loss, pleuritic or exertional cp, hempoptysis, change in activity tolerance  orthopnea pnd or leg swelling Pt also denies any obvious fluctuation in symptoms with weather or environmental change or other alleviating or aggravating factors.       Current Medications (verified): 1)  Hydrochlorothiazide 25 Mg Tabs (Hydrochlorothiazide) .Marland Kitchen.. 1 Once Daily 2)  Pepcid 20 Mg Tabs (Famotidine) .... Take One By Mouth At Bedtime 3)  Omeprazole 40 Mg  Cpdr (Omeprazole) .... Take 1 Capsule By Mouth Once A Day 30-41minutes Before First Meal of The Day 4)  Tramadol Hcl 50 Mg  Tabs (Tramadol Hcl) .... One To Two By Mouth Every 4-6 Hours As Needed For Coughing 5)  Chlorpheniramine Maleate 4 Mg Tabs (Chlorpheniramine Maleate) .Marland Kitchen.. 1 At Bedtime As Needed and Every 4 Hrs During The Day As Needed 6)  Singulair 10 Mg  Tabs (Montelukast Sodium) .... One By Mouth Each Pm  Allergies (verified): No Known Drug Allergies  Past History:  Past Medical History: Chronic cough ..........................Marland KitchenWert     - Allergy profile Jan 28 2010 IgE 186 pos to grass/mold only     - Sinus CT March 19, 2010 >>> neg     - Methacholine challenge test  04/02/10  >  POSITIVE at 2nd dose but no symptoms    -  Singulair stopped May 15, 2010 not effective    - Qvar 40 started May 15, 2010 >>  Vital Signs:  Patient profile:   45 year old male Weight:      212 pounds O2 Sat:      97 % on Room air Temp:     98.1 degrees F oral Pulse rate:   77 / minute BP sitting:   122 / 78  (left arm) Cuff size:   large  Vitals Entered By: Vernie Murders (May 15, 2010 12:15 PM)  O2 Flow:  Room air  Physical Exam  Additional Exam:  amb wm with affect brighter, still freq  throat clearing wt 200 January 29, 2010 > 209 April 04, 2010 > 214 April 17, 2010 > 212 May 17, 2010  HEENT: nl dentition, turbinates, and orophanx is pristine. Nl external ear canals without cough reflex NECK :  without JVD/Nodes/TM/ nl carotid upstrokes bilaterally LUNGS: no acc muscle use, clear to A and P bilaterally without cough on insp or exp maneuvers CV:  RRR  no s3 or murmur or increase in P2, no edema  ABD:  soft and nontender with nl excursion in the supine position. No bruits or organomegaly, bowel sounds nl MS:  warm without deformities, calf tenderness, cyanosis or clubbing       Impression & Recommendations:  Problem # 1:  ASTHMA, UNSPECIFIED (ICD-493.90) Dx with pos  MCT,  no response to singulair so next step is add qvar  I spent extra time with the patient today explaining optimal mdi  technique.  This improved from  25-50% but still clearly needs work  Problem # 2:  COUGH (ICD-786.2)  May not be directly related to asthma and more c/w   Classic Upper airway cough syndrome, so named because it's frequently impossible to sort out how much is  CR/sinusitis with freq throat clearing (which can  be related to primary GERD)   vs  causing  secondary extra esophageal GERD from wide swings in gastric pressure that occur with throat clearing, promoting self use of mint and menthol lozenges that reduce the lower esophageal sphincter tone and exacerbate the problem further These are the same pts who not infrequently have failed to tolerate ace inhibitors,  dry powder inhalers or biphosphonates or report having reflux symptoms that don't respond to standard doses of PPI   Qvar may actually make this component worse but less so than all the other ics so worth a try.  In meantime emphasize continued rx of gerd and CR. See instructions for specific recommendations   Orders: Est. Patient Level IV ([address])  Medications Added to Medication List This Visit: 1)  Qvar 40 Mcg/act Aers (Beclomethasone dipropionate) .... 2 puffs first thing  in am and 2 puffs again in pm about 12 hours later 2)  Prednisone 10 Mg Tabs (Prednisone) .... 4 each am x 2days, 2x2days, 1x2days and stop  Patient Instructions: 1)  Stop singulair 2)  Qvar 40 2 puffs first thing  in am and 2 puffs again in pm about 12 hours later - if you like it fill the presciption in 25 days 3)  Prednisone 10 mg 4 each am x 2days, 2x2days, 1x2days and stop 4)   use enough tramadol to eliminate the cough Prescriptions: PREDNISONE 10 MG  TABS (PREDNISONE) 4 each am x 2days, 2x2days, 1x2days and stop  #14 x 0   Entered and Authorized by:   Nyoka Cowden MD   Signed by:   Nyoka Cowden MD on 05/15/2010   Method used:    Electronically to        [facility] [address] [id number]* (retail)       [address] [place], [place]  [address]       Ph: [phone]       Fax: [phone]   RxID:   [id number] QVAR 40 MCG/ACT  AERS (BECLOMETHASONE DIPROPIONATE) 2 puffs first thing  in am and 2 puffs again in pm about 12 hours later  #1 x 11   Entered and Authorized by:   Nyoka Cowden MD   Signed by:   Nyoka Cowden MD on 05/15/2010   Method used:   Print then Give to Patient   RxID:   [id number]

## 2010-06-01 ENCOUNTER — Encounter: Payer: Self-pay | Admitting: Internal Medicine

## 2010-06-04 ENCOUNTER — Encounter: Payer: Self-pay | Admitting: Internal Medicine

## 2010-06-05 ENCOUNTER — Ambulatory Visit: Payer: 59 | Admitting: Internal Medicine

## 2010-06-08 ENCOUNTER — Encounter: Payer: Self-pay | Admitting: Internal Medicine

## 2010-06-08 ENCOUNTER — Ambulatory Visit (INDEPENDENT_AMBULATORY_CARE_PROVIDER_SITE_OTHER): Payer: 59 | Admitting: Internal Medicine

## 2010-06-08 VITALS — BP 118/80 | HR 63 | Temp 98.5°F | Ht 69.0 in | Wt 213.6 lb

## 2010-06-08 DIAGNOSIS — R059 Cough, unspecified: Secondary | ICD-10-CM

## 2010-06-08 DIAGNOSIS — R05 Cough: Secondary | ICD-10-CM

## 2010-06-08 DIAGNOSIS — J45909 Unspecified asthma, uncomplicated: Secondary | ICD-10-CM

## 2010-06-08 MED ORDER — PREDNISONE (PAK) 10 MG PO TABS: ORAL_TABLET | ORAL | Status: AC

## 2010-06-08 MED ORDER — BECLOMETHASONE DIPROPIONATE 40 MCG/ACT IN AERS: 2.0000 | INHALATION_SPRAY | Freq: Two times a day (BID) | RESPIRATORY_TRACT | Status: DC

## 2010-06-08 NOTE — Progress Notes (Signed)
Subjective:    Patient ID: Alexander Mclean, male    DOB: 09-08-1965, 45 y.o.   MRN: [id number]  HPI  71 yobm with ? EIA outgrew age 27 and never required treatment but cough since 07/2009 with POSITIVE methacholine challenge 03/2010 c/w cough variant asthma.   January 28, 2010 1st pulmonary office eval cc cough = new onset x 6 months indolent onset progressivly worse throat itching assoc with sensation of pnds and eyes watery no better with zyrtec, no better with asthmanex, much better p prednisone for several weeks then recurred, seems worst when lie down , mostly dry minimal mucoid sputum, assoc with mild HB but no overt sinus c/o's.  w/u with pos allergy screen with IgE 186 grass/ mold only  imp was uacs/pnds  rec stop zyrtec  Change nexium Take one 30-60 min before first meal of the day and add pepcid 20 mg one at bedtime  Prednisone 4 each am x 2days, 2x2days, 1x2days and stop  if itching sneezing or runny nose, scratchy throat take chlortrimeton 4 mg every 6 hours as needed  for cough try delsym 2 tsp every 12 hours as needed   February 24, 2010 ov cough better on rx then worse once ran out nexium despite continuing hs h2 and h1 per guidelines. GERD (REFLUX) diet  Resume nexium 40 mg Take one 30-60 min before first meal of the day (equivalent of prilosec otc 20 mg x 2)  Prednisone 10 mg 4 each am x 2days, 2x2days, 1x2days and stop  No better at all but thinks caught cold over Christmas ended up in UC 03/08/10 with cough yellow mucus dx of sinus dx treated with amoxicillin and hydromet > no better   March 19, 2010 ov cough is worse esp when lie down at night despite taking omeprazole 40 mg one in am and pepcid 20 mg one at bedtime, never controlled even with tramadol. rec GERD (REFLUX) diet  Prednisone 4 each am x 2 days, 3 x 2days, 2x2 days, and 1x2 days  Take delsym two tsp every 12 hours and add tramadol 50 mg up to every 4 hours to suppress the urge to cough.  Sinus ct > neg     April 02, 2010 2 wk. f/u-cough-better,prod. yellow in am,clear in pm,no sob or wheezing, no noct disturbance, no longer using tramadol, did not bring in the pepcid but has everything else in hand including chlortrimeton, had not taken by hour of ov and freq throat clearing noted during the interview with some sniffling. See Patient Care Coordinator before leaving for methacholine challenge test  Take delsym two tsp every 12 hours and add tramadol 50 mg up to every 4 hours if needed to suppress the urge to cough. Swallowing water or using ice chips/non mint and menthol containing candies (such as lifesavers or sugarless jolly ranchers) are also effective.  Always bring all active medications with you to the office   1/31/ 2012 methacholine POSITIVE at second dose but no symptoms of cough/ tightness   April 17, 2010 ov better to his satisfaction but still gets tickle in throat requiring chortrimeton and tramdol daily. no excess mucus, no sob. rec Singulair 10 mg one each and every pm to see if cough / drainage resolves but continue all the other medications the same   May 15, 2010 ov cc cough no better with singulair esp at hs. pnds better with h1 but not helping cough. no sign sob.  rec 1) Qvar 40 Mcg/act  Aers (Beclomethasone dipropionate) .... 2 puffs first thing in am and 2 puffs again in pm about 12 hours later  2) Prednisone 10 Mg Tabs (Prednisone) .... 4 each am x 2days, 2x2days, 1x2days and stop  Patient Instructions:  1) Stop singulair  2) Qvar 40 2 puffs first thing in am and 2 puffs again in pm about 12 hours later - if you like it fill the presciption in 25 days  3) Prednisone 10 mg 4 each am x 2days, 2x2days, 1x2days and stop  4) use enough tramadol to eliminate the cough  06/08/2010 ov only better while on prednisone, then worse cough off of it but much improved vs baseline.  No excess mucus or noct awakening. Pt denies any significant sore throat, dysphagia, itching, sneezing,   nasal congestion or excess/ purulent secretions,  fever, chills, sweats, unintended wt loss, pleuritic or exertional cp, hempoptysis, orthopnea pnd or leg swelling.    Also denies any obvious fluctuation of symptoms with weather or environmental changes or other aggravating or alleviating factors.       :  Past Medical History:  Chronic cough ..........................Marland KitchenWert  - Allergy profile Jan 28 2010 IgE 186 pos to grass/mold only  - Sinus CT March 19, 2010 >>> neg  - Methacholine challenge test 04/02/10 > POSITIVE at 2nd dose but no symptoms  - Singulair stopped May 15, 2010 not effective  - Qvar 40 started May 15, 2010 >>         Review of Systems     Objective:   Physical Exam     amb wm with affect brighter, still freq throat clearing  wt 200 January 29, 2010 > 209 April 04, 2010 > 214 April 17, 2010 > 212 May 17, 2010  >  213 06/08/2010  HEENT: nl dentition, turbinates, and orophanx is pristine. Nl external ear canals without cough reflex  NECK : without JVD/Nodes/TM/ nl carotid upstrokes bilaterally  LUNGS: no acc muscle use, clear to A and P bilaterally without cough on insp or exp maneuvers  CV: RRR no s3 or murmur or increase in P2, no edema  ABD: soft and nontender with nl excursion in the supine position. No bruits or organomegaly, bowel sounds nl  MS: warm without deformities, calf tenderness, cyanosis or clubbing     Assessment & Plan:

## 2010-06-08 NOTE — Assessment & Plan Note (Signed)
.  Chronic cough is often simultaneously caused by more than one condition. A single cause has been found from 38 to 82% of the time, multiple causes from 18 to 62%. Multiply caused cough has been the result of three diseases up to 42% of the time.  Therefore critical he continue rx for gerd and pnds as per instructions.

## 2010-06-08 NOTE — Patient Instructions (Signed)
Work on inhaler technique:  relax and gently blow all the way out then take a nice smooth deep breath back in, triggering the inhaler at same time you start breathing in.  Hold for up to 5 seconds if you can.  Rinse and gargle with water when done   If your mouth or throat starts to bother you,   I suggest you time the inhaler to your dental care and after using the inhaler(s) brush teeth and tongue with a baking soda containing toothpaste and when you rinse this out, gargle with it first to see if this helps your mouth and throat.    Prednisone 10 mg Take 4 for two days, three for two days, two for two days, then one for two days and stop

## 2010-06-08 NOTE — Assessment & Plan Note (Signed)
Cough variant asthma most likely dx.  Key is to reach the target tissue with effective dose of ICS as prednisone is quite effective.  The proper method of use, as well as anticipated side effects, of this metered-dose inhaler are discussed and demonstrated to the patient. This approached 75% with repeated coaching.

## 2010-07-16 ENCOUNTER — Telehealth: Payer: Self-pay | Admitting: Internal Medicine

## 2010-07-16 ENCOUNTER — Ambulatory Visit (INDEPENDENT_AMBULATORY_CARE_PROVIDER_SITE_OTHER): Payer: 59 | Admitting: Internal Medicine

## 2010-07-16 ENCOUNTER — Encounter: Payer: Self-pay | Admitting: Internal Medicine

## 2010-07-16 DIAGNOSIS — R059 Cough, unspecified: Secondary | ICD-10-CM

## 2010-07-16 DIAGNOSIS — R05 Cough: Secondary | ICD-10-CM

## 2010-07-16 DIAGNOSIS — J45909 Unspecified asthma, uncomplicated: Secondary | ICD-10-CM

## 2010-07-16 MED ORDER — BECLOMETHASONE DIPROPIONATE 80 MCG/ACT IN AERS: INHALATION_SPRAY | RESPIRATORY_TRACT | Status: DC

## 2010-07-16 MED ORDER — PREDNISONE (PAK) 10 MG PO TABS: ORAL_TABLET | ORAL | Status: AC

## 2010-07-16 MED ORDER — BECLOMETHASONE DIPROPIONATE 80 MCG/ACT IN AERS: 1.0000 | INHALATION_SPRAY | Freq: Two times a day (BID) | RESPIRATORY_TRACT | Status: DC

## 2010-07-16 NOTE — Assessment & Plan Note (Signed)
.  Chronic cough is often simultaneously caused by more than one condition. A single cause has been found from 38 to 82% of the time, multiple causes from 18 to 62%. Multiply caused cough has been the result of three diseases up to 42% of the time.     Therefore it is likely that more than one mechanism at play in this case. See instructions for specific recommendations which were reviewed directly with the patient who was given a copy with highlighter outlining the key components.

## 2010-07-16 NOTE — Telephone Encounter (Signed)
OV with MW at 2:30 today

## 2010-07-16 NOTE — Assessment & Plan Note (Signed)
Symptoms are markedly disproportionate to objective findings and not clear this is a lung problem but pt does appear to have difficult airway management issues. DDX of  difficult airways managment all start with A and  include Adherence, Ace Inhibitors, Acid Reflux, Active Sinus Disease, Alpha 1 Antitripsin deficiency, Anxiety masquerading as Airways dz,  ABPA,  allergy(esp in young), Aspiration (esp in elderly), Adverse effects of DPI,  Active smokers, plus two Bs  = Bronchiectasis and Beta blocker use..and one C= CHF   Adherence is always the initial "prime suspect" and is a multilayered concern that requires a "trust but verify" approach in every patient - starting with knowing how to use medications, especially inhalers, correctly, keeping up with refills and understanding the fundamental difference between maintenance and prns vs those medications only taken for a very short course and then stopped and not refilled.  The proper method of use, as well as anticipated side effects, of this metered-dose inhaler are discussed and demonstrated to the patient. Improved to 90% effective  ? Allergy:  Next step is allergy referral  ? Acid reflux: ? Flare with etoh suggests this but wouldn't explain the reported prednisone benefit that lasts for weeks  Rec try first max dose qvar, no heavy cough suppression this time and only 6 days prednsione on max gerd rx with diet/ ppi/h2 hs

## 2010-07-16 NOTE — Telephone Encounter (Signed)
lmomtcb x1 

## 2010-07-16 NOTE — Patient Instructions (Signed)
Increase the qvar to 80  Take 2 puffs first thing in am and then another 2 puffs about 12 hours later. (2 puffs of 80 pt 4 pf the 40 )  Prednisone 10 mg take  4 each am x 2 days,   2 each am x 2 days,  1 each am x2days and stop   GERD (REFLUX)  is an extremely common cause of respiratory symptoms, many times with no significant heartburn at all.    It can be treated with medication, but also with lifestyle changes including avoidance of late meals, excessive alcohol(more than 2 drinks) , smoking cessation, and avoid fatty foods, chocolate, peppermint, colas, red wine, and acidic juices such as orange juice.  NO MINT OR MENTHOL PRODUCTS SO NO COUGH DROPS  USE SUGARLESS CANDY INSTEAD (jolley ranchers or Stover's)  NO OIL BASED VITAMINS   Please schedule a follow up office visit in 6 weeks, call sooner if needed

## 2010-07-16 NOTE — Progress Notes (Signed)
Subjective:    Patient ID: Alexander Mclean, male    DOB: 1966/01/05, 45 y.o.   MRN: [id number]  HPI 20 yobm with ? EIA outgrew age 46 and never required treatment but cough since 07/2009 with POSITIVE methacholine challenge 03/2010 c/w cough variant asthma.   January 28, 2010 1st pulmonary office eval cc cough = new onset x 6 months indolent onset progressivly worse throat itching assoc with sensation of pnds and eyes watery no better with zyrtec, no better with asthmanex, much better p prednisone for several weeks then recurred, seems worst when lie down , mostly dry minimal mucoid sputum, assoc with mild HB but no overt sinus c/o's.  w/u with pos allergy screen with IgE 186 grass/ mold only  imp was uacs/pnds  rec stop zyrtec  Change nexium Take one 30-60 min before first meal of the day and add pepcid 20 mg one at bedtime  Prednisone 4 each am x 2days, 2x2days, 1x2days and stop  if itching sneezing or runny nose, scratchy throat take chlortrimeton 4 mg every 6 hours as needed  for cough try delsym 2 tsp every 12 hours as needed   February 24, 2010 ov cough better on rx then worse once ran out nexium despite continuing hs h2 and h1 per guidelines. GERD (REFLUX) diet  Resume nexium 40 mg Take one 30-60 min before first meal of the day (equivalent of prilosec otc 20 mg x 2)  Prednisone 10 mg 4 each am x 2days, 2x2days, 1x2days and stop  No better at all but thinks caught cold over Christmas ended up in UC 03/08/10 with cough yellow mucus dx of sinus dx treated with amoxicillin and hydromet > no better   March 19, 2010 ov cough is worse esp when lie down at night despite taking omeprazole 40 mg one in am and pepcid 20 mg one at bedtime, never controlled even with tramadol. rec GERD (REFLUX) diet  Prednisone 4 each am x 2 days, 3 x 2days, 2x2 days, and 1x2 days  Take delsym two tsp every 12 hours and add tramadol 50 mg up to every 4 hours to suppress the urge to cough.  Sinus ct > neg    April 02, 2010 cc cough-better,prod. yellow in am,clear in pm,no sob or wheezing, no noct disturbance, no longer using tramadol, did not bring in the pepcid but has everything else in hand including chlortrimeton, had not taken by hour of ov and freq throat clearing noted during the interview rec mct  1/31/ 2012 methacholine POSITIVE at second dose but no symptoms of cough/ tightness   April 17, 2010 ov better to his satisfaction but still gets tickle in throat requiring chortrimeton and tramdol daily. no excess mucus, no sob. rec Singulair 10 mg one each and every pm to see if cough / drainage resolves but continue all the other medications the same   May 15, 2010 ov cc cough no better with singulair esp at hs. pnds better with h1 but not helping cough. no sign sob. rec 1) Qvar 40 Mcg/act Aers (Beclomethasone dipropionate) .... 2 puffs first thing in am and 2 puffs again in pm about 12 hours later  2) Prednisone 10 Mg Tabs (Prednisone) .... 4 each am x 2days, 2x2days, 1x2days and stop but continue acid rx and diet   06/08/2010 ov only better while on prednisone, then worse cough off of it but much improved vs baseline. No excess mucus or noct awakening. rec work on [employer], take  one more short course only of prednisone   07/16/2010  Ov/Corretta Munce cough gone for sev p last dose of prednisone then excess etoh and then sev days later started coughing again mostly when lie down, but not as bad as baseline.     Pt denies any significant sore throat, dysphagia, itching, sneezing,  nasal congestion or excess/ purulent secretions,  fever, chills, sweats, unintended wt loss, pleuritic or exertional cp, hempoptysis, orthopnea pnd or leg swelling.    Also denies any obvious fluctuation of symptoms with weather or environmental changes or other aggravating or alleviating factors.     Past Medical History:  Chronic cough ..........................Marland KitchenWert  - Allergy profile Jan 28 2010 IgE 186 pos to grass/mold  only  - Sinus CT March 19, 2010 >>> neg  - Methacholine challenge test 04/02/10 > POSITIVE at 2nd dose but no symptoms  - Singulair stopped May 15, 2010 not effective  - Qvar 40 started May 15, 2010 >>  No better so increased 80 2bid     Review of Systems     Objective:   Physical Exam    amb wm with affect brighter,  Less  freq throat clearing  wt 200 January 29, 2010 > 209 April 04, 2010  > 213 06/08/2010 > 214 07/16/2010  HEENT: nl dentition, turbinates, and orophanx is pristine. Nl external ear canals without cough reflex  NECK : without JVD/Nodes/TM/ nl carotid upstrokes bilaterally  LUNGS: no acc muscle use, clear to A and P bilaterally without cough on insp or exp maneuvers  CV: RRR no s3 or murmur or increase in P2, no edema  ABD: soft and nontender with nl excursion in the supine position. No bruits or organomegaly, bowel sounds nl  MS: warm without deformities, calf tenderness, cyanosis or clubbing      Assessment & Plan:

## 2010-07-23 ENCOUNTER — Ambulatory Visit: Payer: 59 | Admitting: Internal Medicine

## 2010-08-27 ENCOUNTER — Ambulatory Visit: Payer: 59 | Admitting: Internal Medicine

## 2010-11-10 ENCOUNTER — Telehealth: Payer: Self-pay | Admitting: Internal Medicine

## 2010-11-10 ENCOUNTER — Other Ambulatory Visit: Payer: Self-pay | Admitting: Critical Care Medicine

## 2010-11-10 ENCOUNTER — Other Ambulatory Visit: Payer: Self-pay | Admitting: Internal Medicine

## 2010-11-10 DIAGNOSIS — K219 Gastro-esophageal reflux disease without esophagitis: Secondary | ICD-10-CM

## 2010-11-10 MED ORDER — OMEPRAZOLE 40 MG PO CPDR: 40.0000 mg | DELAYED_RELEASE_CAPSULE | Freq: Every day | ORAL | Status: DC

## 2010-11-10 NOTE — Telephone Encounter (Signed)
Pt wanting refill on omeprazole.  He called right before office closed He then called the answer svc I sent #[phone] to his pharm with no refills,  Dr wert to address need for additional refills in AM

## 2010-11-10 NOTE — Telephone Encounter (Signed)
Needs f/u ov  By end of 4 weeks rx, either me or Tammy NP

## 2010-11-10 NOTE — Telephone Encounter (Signed)
lmomtcb  

## 2010-11-11 MED ORDER — OMEPRAZOLE 40 MG PO CPDR: 40.0000 mg | DELAYED_RELEASE_CAPSULE | Freq: Every day | ORAL | Status: DC

## 2010-11-11 NOTE — Telephone Encounter (Signed)
Rx for omeprazole was sent to [facility]. LMOM for pt to be made aware.

## 2010-11-11 NOTE — Telephone Encounter (Signed)
Pt set to see Dr. Sherene Sires on 12-10-10. Carron Curie, CMA

## 2010-12-08 LAB — BASIC METABOLIC PANEL
BUN: 11
CO2: 28
Calcium: 9
Chloride: 99
Creatinine, Ser: 0.99
GFR calc Af Amer: >60
GFR calc non Af Amer: >60
Glucose, Bld: 88
Potassium: 3.6
Sodium: 134 — ABNORMAL LOW

## 2010-12-08 LAB — DIFFERENTIAL
Basophils Absolute: 0
Basophils Relative: 1
Eosinophils Absolute: 0
Eosinophils Relative: 0
Lymphocytes Relative: 34
Lymphs Abs: 2.5
Monocytes Absolute: 0.3
Monocytes Relative: 5
Neutro Abs: 4.6
Neutrophils Relative %: 61

## 2010-12-08 LAB — CBC
HCT: 42.4
Hemoglobin: 14.4
MCHC: 34.1
MCV: 92.4
Platelets: 288
RBC: 4.59
RDW: 13.1
WBC: 7.5

## 2010-12-08 LAB — CK TOTAL AND CKMB (NOT AT ARMC)
CK, MB: 2.3
Relative Index: 0.8
Total CK: 300 — ABNORMAL HIGH

## 2010-12-08 LAB — TROPONIN I: Troponin I: <0.01

## 2010-12-10 ENCOUNTER — Ambulatory Visit (INDEPENDENT_AMBULATORY_CARE_PROVIDER_SITE_OTHER): Payer: BC Managed Care – PPO | Admitting: Internal Medicine

## 2010-12-10 ENCOUNTER — Encounter: Payer: Self-pay | Admitting: Internal Medicine

## 2010-12-10 VITALS — BP 110/78 | HR 72 | Temp 98.1°F | Ht 69.0 in | Wt 205.8 lb

## 2010-12-10 DIAGNOSIS — J45909 Unspecified asthma, uncomplicated: Secondary | ICD-10-CM

## 2010-12-10 MED ORDER — BECLOMETHASONE DIPROPIONATE 80 MCG/ACT IN AERS: INHALATION_SPRAY | RESPIRATORY_TRACT | Status: DC

## 2010-12-10 NOTE — Progress Notes (Signed)
Subjective:    Patient ID: Alexander Mclean, male    DOB: 1965-04-24, 45 y.o.   MRN: [id number]  HPI  77 yobm with ? EIA outgrew age 43 and never required treatment but cough since 07/2009 with POSITIVE methacholine challenge 03/2010 c/w cough variant asthma.   January 28, 2010 1st pulmonary office eval cc cough = new onset x 6 months indolent onset progressivly worse throat itching assoc with sensation of pnds and eyes watery no better with zyrtec, no better with asthmanex, much better p prednisone for several weeks then recurred, seems worst when lie down , mostly dry minimal mucoid sputum, assoc with mild HB but no overt sinus c/o's.  w/u with pos allergy screen with IgE 186 grass/ mold only  imp was uacs/pnds  rec stop zyrtec  Change nexium Take one 30-60 min before first meal of the day and add pepcid 20 mg one at bedtime  Prednisone 4 each am x 2days, 2x2days, 1x2days and stop  if itching sneezing or runny nose, scratchy throat take chlortrimeton 4 mg every 6 hours as needed  for cough try delsym 2 tsp every 12 hours as needed   February 24, 2010 ov cough better on rx then worse once ran out nexium despite continuing hs h2 and h1 per guidelines. GERD (REFLUX) diet  Resume nexium 40 mg Take one 30-60 min before first meal of the day (equivalent of prilosec otc 20 mg x 2)  Prednisone 10 mg 4 each am x 2days, 2x2days, 1x2days and stop  No better at all but thinks caught cold over Christmas ended up in UC 03/08/10 with cough yellow mucus dx of sinus dx treated with amoxicillin and hydromet > no better   March 19, 2010 ov cough is worse esp when lie down at night despite taking omeprazole 40 mg one in am and pepcid 20 mg one at bedtime, never controlled even with tramadol. rec GERD (REFLUX) diet  Prednisone 4 each am x 2 days, 3 x 2days, 2x2 days, and 1x2 days  Take delsym two tsp every 12 hours and add tramadol 50 mg up to every 4 hours to suppress the urge to cough.  Sinus ct > neg    April 02, 2010 cc cough-better,prod. yellow in am,clear in pm,no sob or wheezing, no noct disturbance, no longer using tramadol, did not bring in the pepcid but has everything else in hand including chlortrimeton, had not taken by hour of ov and freq throat clearing noted during the interview rec mct  1/31/ 2012 methacholine POSITIVE at second dose but no symptoms of cough/ tightness   April 17, 2010 ov better to his satisfaction but still gets tickle in throat requiring chortrimeton and tramdol daily. no excess mucus, no sob. rec Singulair 10 mg one each and every pm to see if cough / drainage resolves but continue all the other medications the same   May 15, 2010 ov cc cough no better with singulair esp at hs. pnds better with h1 but not helping cough. no sign sob. rec 1) Qvar 40 Mcg/act Aers (Beclomethasone dipropionate) .... 2 puffs first thing in am and 2 puffs again in pm about 12 hours later  2) Prednisone 10 Mg Tabs (Prednisone) .... 4 each am x 2days, 2x2days, 1x2days and stop but continue acid rx and diet   06/08/2010 ov only better while on prednisone, then worse cough off of it but much improved vs baseline. No excess mucus or noct awakening. rec work on [employer],  take one more short course only of prednisone   07/16/2010  Ov/Yaman Grauberger cough gone for sev p last dose of prednisone then excess etoh and then sev days later started coughing again mostly when lie down, but not as bad as baseline.    rec Increase the qvar to 80  Take 2 puffs first thing in am and then another 2 puffs about 12 hours later. (2 puffs of 80 pt 4 pf the 40 ) Prednisone 10 mg take  4 each am x 2 days,   2 each am x 2 days,  1 each am x2days and stop  GERD diet    12/10/2010 f/u ov/Hamda Klutts cc cough much better, no problems with ex. Sleeping ok without nocturnal  or early am exac of resp c/o's     Pt denies any significant sore throat, dysphagia, itching, sneezing,  nasal congestion or excess/ purulent secretions,   fever, chills, sweats, unintended wt loss, pleuritic or exertional cp, hempoptysis, orthopnea pnd or leg swelling.    Also denies any obvious fluctuation of symptoms with weather or environmental changes or other aggravating or alleviating factors.     Past Medical History:  Chronic cough ..........................Marland KitchenWert  - Allergy profile Jan 28 2010 IgE 186 pos to grass/mold only  - Sinus CT March 19, 2010 >>> neg  - Methacholine challenge test 04/02/10 > POSITIVE at 2nd dose but no symptoms  - Singulair stopped May 15, 2010 not effective  - Qvar 40 started May 15, 2010 >>  No better so increased 80 2bid > improved    Review of Systems     Objective:   Physical Exam    amb wm with affect brighter,  Less  freq throat clearing  wt 200 January 29, 2010 > 209 April 04, 2010  > 213 06/08/2010 > 214 07/16/2010 >  12/10/2010  205  HEENT: nl dentition, turbinates, and orophanx is pristine. Nl external ear canals without cough reflex  NECK : without JVD/Nodes/TM/ nl carotid upstrokes bilaterally  LUNGS: no acc muscle use, clear to A and P bilaterally without cough on insp or exp maneuvers  CV: RRR no s3 or murmur or increase in P2, no edema  ABD: soft and nontender with nl excursion in the supine position. No bruits or organomegaly, bowel sounds nl  MS: warm without deformities, calf tenderness, cyanosis or clubbing      Assessment & Plan:

## 2010-12-10 NOTE — Assessment & Plan Note (Addendum)
All goals of chronic asthma control met including optimal function and elimination of symptoms with minimal need for rescue therapy.  Contingencies discussed in full including contacting this office immediately if not controlling the symptoms using the rule of two's.     The proper method of use, as well as anticipated side effects, of this metered-dose inhaler are discussed and demonstrated to the patient. Improved to 90% with coaching so probably can use the qvar 80 one twice daily if trying to save on cost  ? GERD role primary vs secondary. Explained natural history of uri and why it's necessary in patients at risk to treat GERD aggressively  at least  short term   to reduce risk of evolving cyclical cough initially  triggered by epithelial injury and a heightened sensitivty to the effects of any upper airway irritants,  most importantly acid - related.  That is, the more sensitive the epithelium damaged for virus, the more the cough, the more the secondary reflux (especially in those prone to reflux) the more the irritation of the sensitive mucosa and so on in a cyclical pattern.

## 2010-12-10 NOTE — Patient Instructions (Addendum)
No change in your qvar but if cost is an issue have your pharmacist suggest a cheaper alternative as long as it's aerosol and not powder (alvesco)  Ok to stop your acid suppression but at the very first sign of any flare use prilosec and pepcid until the cough is gone.   If you are satisfied with your treatment plan let your doctor know and he/she can either refill your medications or you can return here when your prescription runs out.     If in any way you are not 100% satisfied,  please tell us.  If 100% better, tell your friends!

## 2013-12-18 ENCOUNTER — Ambulatory Visit
Admission: RE | Admit: 2013-12-18 | Discharge: 2013-12-18 | Disposition: A | Discharge status: Home / Self Care | Payer: BC Managed Care – PPO | Source: Ambulatory Visit | Attending: Family Medicine | Admitting: Family Medicine

## 2013-12-18 ENCOUNTER — Other Ambulatory Visit: Payer: Self-pay | Admitting: Family Medicine

## 2013-12-18 DIAGNOSIS — M25532 Pain in left wrist: Secondary | ICD-10-CM

## 2014-01-25 ENCOUNTER — Ambulatory Visit (INDEPENDENT_AMBULATORY_CARE_PROVIDER_SITE_OTHER): Payer: BC Managed Care – PPO | Admitting: Physician Assistant

## 2014-01-25 VITALS — BP 120/80 | HR 62 | Temp 97.7°F | Resp 18 | Ht 70.0 in | Wt 197.0 lb

## 2014-01-25 DIAGNOSIS — Z125 Encounter for screening for malignant neoplasm of prostate: Secondary | ICD-10-CM

## 2014-01-25 DIAGNOSIS — Z0000 Encounter for general adult medical examination without abnormal findings: Secondary | ICD-10-CM

## 2014-01-25 DIAGNOSIS — Z130 Encounter for screening for diseases of the blood and blood-forming organs and certain disorders involving the immune mechanism: Secondary | ICD-10-CM

## 2014-01-25 DIAGNOSIS — Z131 Encounter for screening for diabetes mellitus: Secondary | ICD-10-CM

## 2014-01-25 DIAGNOSIS — Z13220 Encounter for screening for lipoid disorders: Secondary | ICD-10-CM

## 2014-01-25 DIAGNOSIS — Z113 Encounter for screening for infections with a predominantly sexual mode of transmission: Secondary | ICD-10-CM

## 2014-01-25 LAB — POCT URINALYSIS DIPSTICK
Bilirubin, UA: NEGATIVE
Blood, UA: NEGATIVE
Glucose, UA: NEGATIVE
Leukocytes, UA: NEGATIVE
Nitrite, UA: NEGATIVE
Protein, UA: NEGATIVE
Spec Grav, UA: 1.02
Urobilinogen, UA: 0.2
pH, UA: 6

## 2014-01-25 LAB — COMPREHENSIVE METABOLIC PANEL
ALT: 35 U/L (ref 0–53)
AST: 28 U/L (ref 0–37)
Albumin: 4.9 g/dL (ref 3.5–5.2)
Alkaline Phosphatase: 52 U/L (ref 39–117)
BUN: 15 mg/dL (ref 6–23)
CO2: 28 mEq/L (ref 19–32)
Calcium: 9.4 mg/dL (ref 8.4–10.5)
Chloride: 99 mEq/L (ref 96–112)
Creat: 0.94 mg/dL (ref 0.50–1.35)
Glucose, Bld: 78 mg/dL (ref 70–99)
Potassium: 3.7 mEq/L (ref 3.5–5.3)
Sodium: 136 mEq/L (ref 135–145)
Total Bilirubin: 1.1 mg/dL (ref 0.2–1.2)
Total Protein: 8.2 g/dL (ref 6.0–8.3)

## 2014-01-25 LAB — POCT CBC
Granulocyte percent: 54.6 %G (ref 37–80)
HCT, POC: 48.2 % (ref 43.5–53.7)
Hemoglobin: 16.1 g/dL (ref 14.1–18.1)
Lymph, poc: 2.5 (ref 0.6–3.4)
MCH, POC: 31.1 pg (ref 27–31.2)
MCHC: 33.4 g/dL (ref 31.8–35.4)
MCV: 92.9 fL (ref 80–97)
MID (cbc): 0.3 (ref 0–0.9)
MPV: 6.9 fL (ref 0–99.8)
POC Granulocyte: 3.3 (ref 2–6.9)
POC LYMPH PERCENT: 41.3 %L (ref 10–50)
POC MID %: 4.1 %M (ref 0–12)
Platelet Count, POC: 268 10*3/uL (ref 142–424)
RBC: 5.19 M/uL (ref 4.69–6.13)
RDW, POC: 13 %
WBC: 6.1 10*3/uL (ref 4.6–10.2)

## 2014-01-25 LAB — LIPID PANEL
Cholesterol: 162 mg/dL (ref 0–200)
HDL: 45 mg/dL (ref >39)
LDL Cholesterol: 107 mg/dL — ABNORMAL HIGH (ref 0–99)
Total CHOL/HDL Ratio: 3.6 Ratio
Triglycerides: 51 mg/dL (ref <150)
VLDL: 10 mg/dL (ref 0–40)

## 2014-01-25 NOTE — Progress Notes (Signed)
Subjective:    Patient ID: Alexander HeraldMichael L Mclean, male    DOB: [birth year], 48 y.o.   MRN: [id number]  HPI Patient presents for complete physical for job health insurance and says he is doing well. Main stressor is work. 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 He is managing. Sleeps ok, but wakes 1-2 times/night to urinate. Has cut fluid intake off by 8 pm. No other urinary sx.  Started working out 8 months ago doing cardio and weights daily. Diet consist of high protein, med-low carb food, like oatmeal, chick and rice. Plenty of vegetables. Water 30- 40 oz. Rare soda once a week. Coffee 1 per morning. Has lost 12 lbs in past year. Working on losing more.  Asthma controlled without medication. HTN controlled with HCTZ and amlodipine. Has been on amlodipine for 2 weeks without side effects and BP at home doing well.   Health Maintenance: Flu shot up to date. TDAP up to date. Eye exam up to date.  No h/o prostate cancer. Aunt is a breast cancer survivor.   Review of Systems  Constitutional: Negative for fever, chills, diaphoresis, activity change, appetite change, fatigue and unexpected weight change.  HENT: Negative for ear pain, facial swelling, hearing loss, tinnitus and trouble swallowing.   Eyes: Negative for photophobia and visual disturbance.  Respiratory: Negative for cough and shortness of breath.   Cardiovascular: Negative for chest pain, palpitations and leg swelling.  Gastrointestinal: Negative for abdominal pain, diarrhea and constipation.  Genitourinary: Negative for dysuria, frequency, hematuria, decreased urine volume, discharge, penile swelling, scrotal swelling, difficulty urinating, genital sores, penile pain and testicular pain.  Musculoskeletal: Positive for back pain (baseline). Negative for neck pain and neck stiffness.  Skin: Negative for rash.  Allergic/Immunologic: Negative for environmental allergies and food  allergies.  Neurological: Negative for dizziness, seizures, syncope, weakness, light-headedness and headaches.  Hematological: Negative for adenopathy.  Psychiatric/Behavioral: Positive for sleep disturbance (secondary to urinating). Negative for behavioral problems, dysphoric mood and agitation. The patient is not nervous/anxious.        Objective:   Physical Exam  Constitutional: He is oriented to person, place, and time. He appears well-developed and well-nourished. No distress.  Blood pressure 120/80, pulse 62, temperature 97.7 F (36.5 C), temperature source Oral, resp. rate 18, height 5\' 10"  (1.778 m), weight 197 lb (89.359 kg), SpO2 98 %.   HENT:  Head: Normocephalic and atraumatic.  Right Ear: Tympanic membrane, external ear and ear canal normal.  Left Ear: Tympanic membrane, external ear and ear canal normal.  Nose: Nose normal. No mucosal edema or rhinorrhea. Right sinus exhibits no maxillary sinus tenderness and no frontal sinus tenderness. Left sinus exhibits no maxillary sinus tenderness and no frontal sinus tenderness.  Mouth/Throat: Uvula is midline, oropharynx is clear and moist and mucous membranes are normal. No oropharyngeal exudate.  Eyes: Conjunctivae and EOM are normal. Pupils are equal, round, and reactive to light. Right eye exhibits no discharge. Left eye exhibits no discharge. No scleral icterus.  Neck: Normal range of motion. Neck supple. No JVD present. No thyromegaly present.  Cardiovascular: Normal rate, regular rhythm, normal heart sounds and intact distal pulses.  Exam reveals no gallop and no friction rub.   No murmur heard. Pulmonary/Chest: Effort normal and breath sounds normal. No stridor. No respiratory distress. He has no wheezes. He has no rhonchi. He has no rales.  Abdominal: Soft. Normal appearance and bowel sounds are normal. He exhibits  no distension and no mass. There is no tenderness. There is no rebound and no guarding. No hernia. Hernia confirmed  negative in the right inguinal area and confirmed negative in the left inguinal area.  Genitourinary: Rectum normal, prostate normal, testes normal and penis normal. Circumcised. No phimosis, paraphimosis, hypospadias, penile erythema or penile tenderness. No discharge found.  Musculoskeletal: Normal range of motion. He exhibits no edema or tenderness.  Lymphadenopathy:    He has no cervical adenopathy.       Right: No inguinal adenopathy present.       Left: No inguinal adenopathy present.  Neurological: He is alert and oriented to person, place, and time. He has normal strength and normal reflexes. He displays no atrophy. No cranial nerve deficit or sensory deficit. He exhibits normal muscle tone. Coordination normal.  Skin: Skin is warm, dry and intact. No rash noted. He is not diaphoretic. No erythema. No pallor.  Psychiatric: He has a normal mood and affect. His speech is normal and behavior is normal. Judgment and thought content normal. Cognition and memory are normal.    Results for orders placed or performed in visit on 01/25/14  POCT CBC  Result Value Ref Range   WBC 6.1 4.6 - 10.2 K/uL   Lymph, poc 2.5 0.6 - 3.4   POC LYMPH PERCENT 41.3 10 - 50 %L   MID (cbc) 0.3 0 - 0.9   POC MID % 4.1 0 - 12 %M   POC Granulocyte 3.3 2 - 6.9   Granulocyte percent 54.6 37 - 80 %G   RBC 5.19 4.69 - 6.13 M/uL   Hemoglobin 16.1 14.1 - 18.1 g/dL   HCT, POC 86.548.2 78.443.5 - 53.7 %   MCV 92.9 80 - 97 fL   MCH, POC 31.1 27 - 31.2 pg   MCHC 33.4 31.8 - 35.4 g/dL   RDW, POC 69.613.0 %   Platelet Count, POC 268 142 - 424 K/uL   MPV 6.9 0 - 99.8 fL  POCT urinalysis dipstick  Result Value Ref Range   Color, UA yellow    Clarity, UA clear    Glucose, UA neg    Bilirubin, UA neg    Ketones, UA trace    Spec Grav, UA 1.020    Blood, UA neg    pH, UA 6.0    Protein, UA neg    Urobilinogen, UA 0.2    Nitrite, UA neg    Leukocytes, UA Negative         Assessment & Plan:  1. Physical exam,  annual Age appropriate guidelines given.  2. Screening for deficiency anemia - POCT CBC  3. Screening for cholesterol level - Lipid panel  4. Screening for diabetes mellitus - POCT urinalysis dipstick - Comprehensive metabolic panel  5. Screening for prostate cancer - PSA  6. Screen for STD (sexually transmitted disease) - GC/Chlamydia Probe Amp - HIV antibody - HSV(herpes simplex vrs) 1+2 ab-IgG - RPR  Jaleeah Slight PA-C  [facility] 01/25/2014 12:23 PM

## 2014-01-25 NOTE — Patient Instructions (Signed)

## 2014-01-25 NOTE — Progress Notes (Signed)
I was directly involved with the patient's care and agree with the diagnosis and treatment plan.  

## 2014-01-26 LAB — GC/CHLAMYDIA PROBE AMP
CT Probe RNA: NEGATIVE
GC Probe RNA: NEGATIVE

## 2014-01-26 LAB — RPR

## 2014-01-26 LAB — HIV ANTIBODY (ROUTINE TESTING W REFLEX): HIV 1&2 Ab, 4th Generation: NONREACTIVE

## 2014-01-26 LAB — PSA: PSA: 1.72 ng/mL (ref <=4.00)

## 2014-01-28 ENCOUNTER — Encounter: Payer: Self-pay | Admitting: Physician Assistant

## 2014-01-28 LAB — HSV(HERPES SIMPLEX VRS) I + II AB-IGG
HSV 1 Glycoprotein G Ab, IgG: 0.36 IV
HSV 2 Glycoprotein G Ab, IgG: <0.1 IV

## 2017-06-17 ENCOUNTER — Ambulatory Visit
Admission: RE | Admit: 2017-06-17 | Discharge: 2017-06-17 | Disposition: A | Discharge status: Home / Self Care | Payer: PRIVATE HEALTH INSURANCE | Source: Ambulatory Visit | Attending: Family Medicine | Admitting: Family Medicine

## 2017-06-17 ENCOUNTER — Other Ambulatory Visit: Payer: Self-pay | Admitting: Family Medicine

## 2017-06-17 DIAGNOSIS — R0789 Other chest pain: Secondary | ICD-10-CM

## 2019-06-07 ENCOUNTER — Ambulatory Visit
Admission: RE | Admit: 2019-06-07 | Discharge: 2019-06-07 | Disposition: A | Discharge status: Home / Self Care | Payer: PRIVATE HEALTH INSURANCE | Source: Ambulatory Visit | Attending: Family Medicine | Admitting: Family Medicine

## 2019-06-07 ENCOUNTER — Other Ambulatory Visit: Payer: Self-pay | Admitting: Family Medicine

## 2019-06-07 ENCOUNTER — Other Ambulatory Visit: Payer: Self-pay

## 2019-06-07 DIAGNOSIS — R05 Cough: Secondary | ICD-10-CM

## 2019-06-07 DIAGNOSIS — R059 Cough, unspecified: Secondary | ICD-10-CM

## 2019-07-04 ENCOUNTER — Encounter: Payer: Self-pay | Admitting: Internal Medicine

## 2019-07-04 ENCOUNTER — Ambulatory Visit (INDEPENDENT_AMBULATORY_CARE_PROVIDER_SITE_OTHER): Payer: PRIVATE HEALTH INSURANCE | Admitting: Internal Medicine

## 2019-07-04 ENCOUNTER — Other Ambulatory Visit: Payer: Self-pay

## 2019-07-04 VITALS — BP 120/70 | HR 94 | Temp 97.7°F | Ht 69.0 in | Wt 218.0 lb

## 2019-07-04 DIAGNOSIS — J301 Allergic rhinitis due to pollen: Secondary | ICD-10-CM

## 2019-07-04 DIAGNOSIS — K219 Gastro-esophageal reflux disease without esophagitis: Secondary | ICD-10-CM

## 2019-07-04 DIAGNOSIS — J4540 Moderate persistent asthma, uncomplicated: Secondary | ICD-10-CM | POA: Diagnosis not present

## 2019-07-04 MED ORDER — ADVAIR HFA 115-21 MCG/ACT IN AERO: 2.0000 | INHALATION_SPRAY | Freq: Two times a day (BID) | RESPIRATORY_TRACT | 12 refills | Status: DC

## 2019-07-04 MED ORDER — MONTELUKAST SODIUM 10 MG PO TABS: 10.0000 mg | ORAL_TABLET | Freq: Every day | ORAL | 11 refills | Status: DC

## 2019-07-04 NOTE — Progress Notes (Signed)
Alexander Mclean    [id number]    1965-09-18  Primary Care Physician:Mclean, Alexander Brow, MD  Referring Physician: Antony Contras, MD Alexander Mclean,  Alexander Mclean [id number] Reason for Consultation: cough Date of Consultation: 07/04/2019  Chief complaint:   Chief Complaint  Patient presents with  . Consult    Cough since Dec, asthma, allergies:  watery eye, nasal congestion, some sneezing and post nasal drip.  sob only with cough.     HPI: Alexander Mclean is a 53 y.o. man with asthma who presents with nasal congestion, cough and watery eyes. Diagnosed with asthma in 2012.  Was given a burst of steroids for his back and thinks it also might have helped his cough.  Went to an urgent care and got another burst of steroids for cough as well as an antibiotic.  Takes Qvar once a Alexander. Taking albuterol 2-3 times/Alexander.  Symptoms are worse at night.  He does have reflux as needed a couple of times a week. Symptoms not well controlled.   On flonase, has been taking cetirizine once in a while as well.  Current Regimen: Qvar. Albuterol prn Asthma Triggers: seasonal allergies Exacerbations in the last year: once History of hospitalization or intubation: never Allergy Testing: had allergy testing in 2012 - multiple environmental allergies.  GERD: yes Allergic Rhinitis: yes ACT:  Asthma Control Test ACT Total Score  07/04/2019 11   Social history: Smoking history: no smoker, passive smoke exposure in childhood Occupation: mental health therapist.  No pets at home.   Social History   Occupational History  . 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 Smoking status: Never Smoker  . Smokeless tobacco: Never Used  Substance and Sexual Activity  . Alcohol use: Yes    Comment: socially  . Drug use: No  . Sexual activity: Not Currently    Relevant family history:  Family History  Problem Relation Age of Onset  . Stroke Mother   . Sarcoidosis Mother   .  Stroke Father   . Deep vein thrombosis Father   . Asthma Neg Hx     Past Medical History:  Diagnosis Date  . Allergic rhinitis   . Allergy   . Asthma   . Chronic cough   . Hypertension     Past Surgical History:  Procedure Laterality Date  . EYE SURGERY     age 17     Review of systems: Review of Systems  Constitutional: Negative for chills and fever.  HENT: Positive for congestion, sinus pain and sore throat.   Eyes: Negative for discharge and redness.  Respiratory: Positive for cough, shortness of breath and wheezing. Negative for hemoptysis.   Cardiovascular: Negative for chest pain and palpitations.  Gastrointestinal: Positive for heartburn. Negative for nausea and vomiting.  Genitourinary: Negative.   Musculoskeletal: Negative for myalgias.  Skin: Positive for rash.  Neurological: Negative for focal weakness.  Endo/Heme/Allergies: Positive for environmental allergies.  Psychiatric/Behavioral: The patient is not nervous/anxious.   All other systems reviewed and are negative.   Physical Exam: Blood pressure 120/70, pulse 94, temperature 97.7 F (36.5 C), temperature source Temporal, height 5\' 9"  (1.753 m), weight 218 lb (98.9 kg), SpO2 100 %. Gen:      No acute distress ENT:  no nasal polyps, mucus membranes moist, but erythematous.  There is cobblestoning in the oropharynx. Lungs:    No increased respiratory effort, symmetric chest wall excursion, clear to  auscultation bilaterally, no wheezes or crackles CV:         Regular rate and rhythm; no murmurs, rubs, or gallops.  No pedal edema Abd:      + bowel sounds; soft, non-tender; no distension MSK: no acute synovitis of DIP or PIP joints, no mechanics hands.  Skin:      Warm and dry; eczema noted on bilateral extensor elbows Neuro: normal speech, no focal facial asymmetry Psych: alert and oriented x3, normal mood and affect   Data Reviewed/Medical Decision Making:  Independent interpretation of  tests: Imaging: . Review of patient's chest x-ray April 2021 images revealed no acute cardiopulmonary process. The patient's images have been independently reviewed by me.    PFTs:  None on file  Labs: Allergy profile Jan 28 2010 IgE 186 pos to grass/mold only   Immunization status:  Immunization History  Administered Date(s) Administered  . Influenza Whole 12/06/2009  . Influenza,inj,Quad PF,6+ Mos 01/06/2019  . Moderna SARS-COVID-2 Vaccination 06/12/2019    . I reviewed prior external note(s) from Dr. Wynelle Link primary care, Dr. Sherene Sires  . I reviewed the result(s) of the labs and imaging as noted above.  . I have ordered PFTs  Assessment:  Moderate persistent asthma, not well controlled Allergic rhinitis GERD  Plan/Recommendations: Mr. Alexander Mclean cough is likely multifactorial related to asthma, allergic rhinitis as well as uncontrolled GERD. He is currently only taking Qvar once a Alexander.  We will increase his asthma therapy to Advair twice daily.  He can continue as needed albuterol His rhinitis he should start Flonase daily and we will add montelukast to his daily cetirizine He needs to start taking daily H2 blocker.  He mentions drinking alcohol which does make his GERD worse.  We discussed how chronic cough and rhinitis can make reflux work and thus propagate the cycle of chronic cough.  I will see him back after he has PFTs.  We discussed disease management and progression at length today.    Return to Care: Return in about 4 weeks (around 08/01/2019).  Alexander Salts, MD Pulmonary and Critical Care Medicine Alexander Mclean [phone]  CC: Alexander Joe, MD

## 2019-07-04 NOTE — Patient Instructions (Addendum)
Take cetirizine once a day. Start taking montelukast everyday - different kind of allergy pill.  Take flonase everyday.  Stop taking qvar. Start taking advair two puffs twice a day.  Start taking pepcid everyday for your reflux.  ------------------------------------------------------------------------------------------------------------------- Metered Dose Inhaler (MDI) Instructions (Albuterol, Advair)  Before using your inhaler for the first time: 1. Take the cap off the mouthpiece. 2. Shake the inhaler for 5 seconds 3. Press down on the canister to spray the medicine into the air. 4. Repeat these steps 3 more times. If you haven't used your inhaler in more than 2 weeks, repeat these steps before using it.  To use your inhaler: 1. Take the cap off the mouthpiece 2. Shake the inhaler for 5 seconds. 3. Hold it upright with your finger on the top of the canister and your thumb on the bottom of the inhaler. 4. Breathe out. 5. Close your lips around the mouthpiece. 6. As you start to inhale the next breath, press down on the canister. 7. Inhale deeply and slowly through your mouth. 8. Hold your breath for 5 to 10 seconds to keep the medicine in your lungs. 9. Let your breath out.   10. Repeat these steps if you are supposed to take 2 puffs.  11. Put the cap back on the mouthpiece 12. Remember to rinse, gargle and spit with water after use if your inhaler has a steroid in it (Advair, Symbicort, Dulera, Qvar, Flovent)  Caring for your MDI and chamber For most MDIs, remove the canister and rinse the plastic holder with warm running water once a week to prevent the holes from getting clogged. Shake well and let air dry. There are some medications in which the inhaler cannot be removed from the holder. These usually need to be cleaned by wiping the mouthpiece with a cloth or cleaning with a dry cotton swab. Refer to the patient instructions that come with your inhaler. Clean the chamber about  once a week. Remove the soft ring at the end of the chamber. Soak the spacer in warm water with a mild detergent. Carefully clean and, rinse, and shake off excess water. Do not hand dry. Allow to completely air dry. Do not store the chamber in a plastic bag.  Checking your MDI It is important that you know how much medication is left in your inhaler. The number of puffs contained in your MDI is printed on the side of the canister. After you have used that number of puffs, you must discard your inhaler even if it continues to spray. Keep track of how many puffs you have used. You also must include priming puffs in this total. If you use an MDI every day for control of symptoms, you can determine how long it will last by dividing the total number of puffs in the MDI by the total puffs you use every day. For example: 2 puffs x 2 times per day = 4 total puffs per day. At 120 puffs, the MDI will last 30 days. If you use an inhaler only when you need to, you must keep track of how many times you spray the inhaler. Some of the newer MDIs have counting devices built in.  If your MDI does not have a dose counter, you can obtain a device that attaches to the MDI and counts down the number of puffs each time you press the inhaler. Ask your health care professional for more information about these devices, as well as how to best  keep track of your medicine without an add-on device (if you prefer).  Flonase - 1 spray on each side of your nose twice a day for first week, then 1 spray on each side.   Instructions for use:  If you also use a saline nasal spray or rinse, use that first.  Position the head with the chin slightly tucked. Use the right hand to spray into the left nostril and the right hand to spray into the left nostril.   Point the bottle away from the septum of your nose (cartilage that divides the two sides of your nose).   Hold the nostril closed on the opposite side from where you will  spray  Spray once and gently sniff to pull the medicine into the higher parts of your nose.  Don't sniff too hard as the medicine will drain down the back of your throat instead.  Repeat with a second spray on the same side if prescribed.  Repeat on the other side of your nose.

## 2019-07-09 ENCOUNTER — Telehealth: Payer: Self-pay | Admitting: Internal Medicine

## 2019-07-09 NOTE — Telephone Encounter (Signed)
Called and spoke with pt and discussed the albuterol inhaler with him. Pt verbalized understanding with the info I stated. Nothing further needed.

## 2019-07-10 ENCOUNTER — Other Ambulatory Visit (HOSPITAL_COMMUNITY)
Admission: RE | Admit: 2019-07-10 | Discharge: 2019-07-10 | Disposition: A | Discharge status: Home / Self Care | Payer: PRIVATE HEALTH INSURANCE | Source: Ambulatory Visit | Attending: Internal Medicine | Admitting: Internal Medicine

## 2019-07-10 DIAGNOSIS — Z01812 Encounter for preprocedural laboratory examination: Secondary | ICD-10-CM | POA: Diagnosis not present

## 2019-07-10 DIAGNOSIS — Z20822 Contact with and (suspected) exposure to covid-19: Secondary | ICD-10-CM | POA: Insufficient documentation

## 2019-07-10 LAB — SARS CORONAVIRUS 2 (TAT 6-24 HRS): SARS Coronavirus 2: NEGATIVE

## 2019-07-12 ENCOUNTER — Other Ambulatory Visit: Payer: Self-pay | Admitting: *Deleted

## 2019-07-12 DIAGNOSIS — J4540 Moderate persistent asthma, uncomplicated: Secondary | ICD-10-CM

## 2019-07-13 ENCOUNTER — Other Ambulatory Visit: Payer: Self-pay

## 2019-07-13 ENCOUNTER — Ambulatory Visit (INDEPENDENT_AMBULATORY_CARE_PROVIDER_SITE_OTHER): Payer: PRIVATE HEALTH INSURANCE

## 2019-07-13 DIAGNOSIS — J4540 Moderate persistent asthma, uncomplicated: Secondary | ICD-10-CM

## 2019-07-13 LAB — NITRIC OXIDE: Nitric Oxide: 11

## 2019-07-19 ENCOUNTER — Telehealth: Payer: Self-pay | Admitting: Internal Medicine

## 2019-07-19 NOTE — Telephone Encounter (Signed)
Spoke with the pt  He states that he is taking the pepcid that Dr Celine Mans rec 20 mg in the am  He states that this has not helped with his reflux  He states his cough has also not improved and he has scheduled appt with Celine Mans for tomorrow  I advised she can discuss reflux meds with him then

## 2019-07-20 ENCOUNTER — Ambulatory Visit (INDEPENDENT_AMBULATORY_CARE_PROVIDER_SITE_OTHER): Payer: PRIVATE HEALTH INSURANCE | Admitting: Internal Medicine

## 2019-07-20 ENCOUNTER — Encounter: Payer: Self-pay | Admitting: Internal Medicine

## 2019-07-20 ENCOUNTER — Other Ambulatory Visit: Payer: Self-pay

## 2019-07-20 VITALS — BP 118/76 | HR 74 | Temp 98.0°F | Ht 69.0 in | Wt 220.8 lb

## 2019-07-20 DIAGNOSIS — R05 Cough: Secondary | ICD-10-CM | POA: Diagnosis not present

## 2019-07-20 DIAGNOSIS — K219 Gastro-esophageal reflux disease without esophagitis: Secondary | ICD-10-CM

## 2019-07-20 DIAGNOSIS — J4540 Moderate persistent asthma, uncomplicated: Secondary | ICD-10-CM | POA: Diagnosis not present

## 2019-07-20 DIAGNOSIS — R053 Chronic cough: Secondary | ICD-10-CM

## 2019-07-20 MED ORDER — OMEPRAZOLE 40 MG PO CPDR: 40.0000 mg | DELAYED_RELEASE_CAPSULE | Freq: Every day | ORAL | 11 refills | Status: DC

## 2019-07-20 NOTE — Progress Notes (Signed)
Alexander Mclean    [id number]    20-Jan-1966  Primary Care Physician:Swayne, Onalee Hua, MD Date of Appointment: 07/20/2019 Established Patient Visit  Chief complaint:   Chief Complaint  Patient presents with  . Follow-up     HPI: Alexander Mclean is a 54 y.o. man with asthma and chronic cough.  Interval Updates: Presents today for follow up after starting advair and getting spirometry. Feels his cough is not better. Feels like it is related to reflux because night time symptoms have been bad. Having clear salty phlegm and can't sleep all night.   Adherent to all therapies including montelukast, xyzal, advair. No thrush or adverse effects to advair.   Current Regimen: advair BID, prn albuterol Asthma Triggers: Exacerbations in the last year: History of hospitalization or intubation: Hives: denies Allergy Testing: never had GERD: yes, not well controlled on H2 blocker.  Allergic Rhinitis: yes on flonase, montelukast, cetirizine ACT:  Asthma Control Test ACT Total Score  07/20/2019 9  07/04/2019 11   FeNO: 11 ppb  I have reviewed the patient's family social and past medical history and updated as appropriate.   Past Medical History:  Diagnosis Date  . Allergic rhinitis   . Allergy   . Asthma   . Chronic cough   . Hypertension     Past Surgical History:  Procedure Laterality Date  . EYE SURGERY     age 70    Family History  Problem Relation Age of Onset  . Stroke Mother   . Sarcoidosis Mother   . Stroke Father   . Deep vein thrombosis Father   . Asthma Neg Hx     Social History   Occupational History  . 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 Smoking status: Never Smoker  . Smokeless tobacco: Never Used  Substance and Sexual Activity  . Alcohol use: Yes    Comment: socially  . Drug use: No  . Sexual activity: Not Currently     Physical Exam: Blood pressure 118/76, pulse 74, temperature 98 F (36.7 C), temperature source Temporal,  height 5\' 9"  (1.753 m), weight 220 lb 12.8 oz (100.2 kg), SpO2 98 %.  Gen:      No acute distress ENT:  no nasal polyps, mucus membranes moist Lungs:    No increased respiratory effort, symmetric chest wall excursion, clear to auscultation bilaterally, no wheezes or crackles CV:         Regular rate and rhythm; no murmurs, rubs, or gallops.  No pedal edema   Data Reviewed: Imaging: I have personally reviewed the chest xray April 2021 No acute cardiopulmonary process  PFTs: I have personally reviewed the patient's PFTs and spirometry today shows no airflow limitation, no significant BD response  Labs: Exhaled NO is 11 ppb today  Immunization status: Immunization History  Administered Date(s) Administered  . Influenza Whole 12/06/2009  . Influenza,inj,Quad PF,6+ Mos 01/06/2019  . Moderna SARS-COVID-2 Vaccination 06/12/2019, 07/11/2019    Assessment:  Moderate persistent asthma, not well controlled Allergic rhinitis GERD - not well controlled  Plan/Recommendations: Mr. 09/10/2019 cough is likely multifactorial related to asthma, allergic rhinitis, but mostly  uncontrolled GERD this time.  He can continue as needed albuterol with scheduled advair  Continue for rhinitis Flonase daily montelukast to his daily xyzal  Will increase to once daily PPI instead of H2 blocker.  We discussed how chronic cough and rhinitis can make reflux work and thus propagate the cycle of  chronic cough.    Return to Care: Return in about 4 months (around 11/20/2019).   Alexander Llamas, MD Pulmonary and [place]

## 2019-07-20 NOTE — Patient Instructions (Addendum)
Start taking new medication for acid reflux - omeprazole.  Keep taking your asthma meds and allergy meds.   The patient should have follow up scheduled with myself in 4 months.    What is GERD? Gastroesophageal reflux disease (GERD) is gastroesophageal reflux diseasewhich occurs when the lower esophageal sphincter (LES) opens spontaneously, for varying periods of time, or does not close properly and stomach contents rise up into the esophagus. GER is also called acid reflux or acid regurgitation, because digestive juices--called acids--rise up with the food. The esophagus is the tube that carries food from the mouth to the stomach. The LES is a ring of muscle at the bottom of the esophagus that acts like a valve between the esophagus and stomach.  When acid reflux occurs, food or fluid can be tasted in the back of the mouth. When refluxed stomach acid touches the lining of the esophagus it may cause a burning sensation in the chest or throat called heartburn or acid indigestion. Occasional reflux is common. Persistent reflux that occurs more than twice a week is considered GERD, and it can eventually lead to more serious health problems. People of all ages can have GERD. Studies have shown that GERD may worsen or contribute to asthma, chronic cough, and pulmonary fibrosis.   What are the symptoms of GERD? The main symptom of GERD in adults is frequent heartburn, also called acid indigestion--burning-type pain in the lower part of the mid-chest, behind the breast bone, and in the mid-abdomen.  Not all reflux is acidic in nature, and many patients don't have heart burn at all. Sometimes it feels like a cough (either dry or with mucus), choking sensation, asthma, shortness of breath, waking up at night, frequent throat clearing, or trouble swallowing.    What causes GERD? The reason some people develop GERD is still unclear. However, research shows that in people with GERD, the LES relaxes while the  rest of the esophagus is working. Anatomical abnormalities such as a hiatal hernia may also contribute to GERD. A hiatal hernia occurs when the upper part of the stomach and the LES move above the diaphragm, the muscle wall that separates the stomach from the chest. Normally, the diaphragm helps the LES keep acid from rising up into the esophagus. When a hiatal hernia is present, acid reflux can occur more easily. A hiatal hernia can occur in people of any age and is most often a normal finding in otherwise healthy people over age 86. Most of the time, a hiatal hernia produces no symptoms.   Other factors that may contribute to GERD include - Obesity or recent weight gain - Pregnancy  - Smoking  - Diet - Certain medications  Common foods that can worsen reflux symptoms include: - carbonated beverages - artificial sweeteners - citrus fruits  - chocolate  - drinks with caffeine or alcohol  - fatty and fried foods  - garlic and onions  - mint flavorings  - spicy foods  - tomato-based foods, like spaghetti sauce, salsa, chili, and pizza   Lifestyle Changes If you smoke, stop.  Avoid foods and beverages that worsen symptoms (see above.) Lose weight if needed.  Eat small, frequent meals.  Wear loose-fitting clothes.  Avoid lying down for 3 hours after a meal.  Raise the head of your bed 6 to 8 inches by securing wood blocks under the bedposts. Just using extra pillows will not help, but using a wedge-shaped pillow may be helpful.  Medications  H2 blockers,  such as cimetidine (Tagamet HB), famotidine (Pepcid AC), nizatidine (Axid AR), and ranitidine (Zantac 75), decrease acid production. They are available in prescription strength and over-the-counter strength. These drugs provide short-term relief and are effective for about half of those who have GERD symptoms.  Proton pump inhibitors include omeprazole (Prilosec, Zegerid), lansoprazole (Prevacid), pantoprazole (Protonix), rabeprazole  (Aciphex), and esomeprazole (Nexium), which are available by prescription. Prilosec is also available in over-the-counter strength. Proton pump inhibitors are more effective than H2 blockers and can relieve symptoms and heal the esophageal lining in almost everyone who has GERD.  Because drugs work in different ways, combinations of medications may help control symptoms. People who get heartburn after eating may take both antacids and H2 blockers. The antacids work first to neutralize the acid in the stomach, and then the H2 blockers act on acid production. By the time the antacid stops working, the H2 blocker will have stopped acid production. Your health care provider is the best source of information about how to use medications for GERD.   Points to Remember 1. You can have GERD without having heartburn. Your symptoms could include a dry cough, asthma symptoms, or trouble swallowing.  2. Taking medications daily as prescribed is important in controlling you symptoms.  Sometimes it can take up to 8 weeks to fully achieve the effects of the medications prescribed.  3. Coughing related to GERD can be difficult to treat and is very frustrating!  However, it is important to stick with these medications and lifestyle modifications before pursuing more aggressive or invasive test and treatments.

## 2019-08-01 ENCOUNTER — Ambulatory Visit: Payer: PRIVATE HEALTH INSURANCE | Admitting: Internal Medicine

## 2020-05-12 ENCOUNTER — Other Ambulatory Visit: Payer: Self-pay | Admitting: Orthopedic Surgery

## 2020-05-12 DIAGNOSIS — M546 Pain in thoracic spine: Secondary | ICD-10-CM

## 2020-06-03 ENCOUNTER — Ambulatory Visit
Admission: RE | Admit: 2020-06-03 | Discharge: 2020-06-03 | Disposition: A | Discharge status: Home / Self Care | Payer: PRIVATE HEALTH INSURANCE | Source: Ambulatory Visit | Attending: Orthopedic Surgery | Admitting: Orthopedic Surgery

## 2020-06-03 ENCOUNTER — Other Ambulatory Visit: Payer: Self-pay

## 2020-06-03 DIAGNOSIS — M546 Pain in thoracic spine: Secondary | ICD-10-CM

## 2020-09-02 ENCOUNTER — Other Ambulatory Visit: Payer: Self-pay | Admitting: Internal Medicine

## 2020-09-02 DIAGNOSIS — J4540 Moderate persistent asthma, uncomplicated: Secondary | ICD-10-CM

## 2020-09-02 NOTE — Telephone Encounter (Signed)
PATIENT NEEDS APPT

## 2020-09-04 ENCOUNTER — Telehealth: Payer: Self-pay | Admitting: Internal Medicine

## 2020-09-04 DIAGNOSIS — J4540 Moderate persistent asthma, uncomplicated: Secondary | ICD-10-CM

## 2020-09-04 MED ORDER — ADVAIR HFA 115-21 MCG/ACT IN AERO
2.0000 | INHALATION_SPRAY | Freq: Two times a day (BID) | RESPIRATORY_TRACT | 5 refills | Status: DC
Start: 2020-09-04 — End: 2020-09-29

## 2020-09-04 NOTE — Telephone Encounter (Signed)
Called and spoke with patient. He was requesting a refill on his Advair 115-21 inhaler. He has been scheduled for a 33yr f/u with Beth on 09/29/20 at 930am. I advised him that I would go ahead and send in the Advair for him. He verbalized understanding..   Nothing further needed at time of call.

## 2020-09-29 ENCOUNTER — Encounter: Payer: Self-pay | Admitting: Primary Care

## 2020-09-29 ENCOUNTER — Ambulatory Visit (INDEPENDENT_AMBULATORY_CARE_PROVIDER_SITE_OTHER): Payer: No Typology Code available for payment source | Admitting: Primary Care

## 2020-09-29 ENCOUNTER — Other Ambulatory Visit: Payer: Self-pay

## 2020-09-29 VITALS — BP 112/78 | HR 79 | Ht 69.0 in | Wt 211.0 lb

## 2020-09-29 DIAGNOSIS — J4540 Moderate persistent asthma, uncomplicated: Secondary | ICD-10-CM

## 2020-09-29 MED ORDER — MONTELUKAST SODIUM 10 MG PO TABS: 10.0000 mg | ORAL_TABLET | Freq: Every day | ORAL | 11 refills | Status: DC

## 2020-09-29 MED ORDER — BECLOMETHASONE DIPROP HFA 80 MCG/ACT IN AERB: 2.0000 | INHALATION_SPRAY | Freq: Every day | RESPIRATORY_TRACT | 11 refills | Status: DC

## 2020-09-29 MED ORDER — ALBUTEROL SULFATE HFA 108 (90 BASE) MCG/ACT IN AERS: INHALATION_SPRAY | RESPIRATORY_TRACT | 0 refills | Status: DC

## 2020-09-29 NOTE — Progress Notes (Signed)
$'@Patient'D$  ID: Alexander Mclean, male    DOB: May 11, 1965, 55 y.o.   MRN: [id number]  Chief Complaint  Patient presents with   Asthma    Referring provider: Antony Contras, MD  HPI: 55 year old male, never smoked.  Past medical history significant for moderate persistent asthma, chronic cough and GERD.  Patient of Dr. Shearon Stalls, in office on 07/20/2019.  Previous [facility] pulmonary encounter:  Interval Updates: Presents today for follow up after starting advair and getting spirometry. Feels his cough is not better. Feels like it is related to reflux because night time symptoms have been bad. Having clear salty phlegm and can't sleep all night.   Adherent to all therapies including montelukast, xyzal, advair. No thrush or adverse effects to advair.   Current Regimen: advair BID, prn albuterol Asthma Triggers: Exacerbations in the last year: History of hospitalization or intubation: Hives: denies Allergy Testing: never had GERD: yes, not well controlled on H2 blocker.  Allergic Rhinitis: yes on flonase, montelukast, cetirizine ACT:  Asthma Control Test ACT Total Score  07/20/2019 9  07/04/2019 11   FeNO: 11 ppb   09/29/2020- Interim hx  Patient presents today for annual follow-up/asthma. He is doing well, no acute complaints. He is compliant with Qvar 57mcg, he has been taking 2 puffs every morning. He has not needed to use his Albuterol rescue inhaler in the last year. He has a nocturnal cough and PND symptoms. He is not currently taking Singulair. He has been working on weight loss, he is down 9 lbs from last year. He has been walking and going to the gym 6 days a week. He has no asthma symptoms when at the gym.   ACT score 24  No Known Allergies  Immunization History  Administered Date(s) Administered   Influenza Whole 12/06/2009   Influenza,inj,Quad PF,6+ Mos 01/06/2019   Moderna Sars-Covid-2 Vaccination 06/12/2019, 07/11/2019    Past Medical History:  Diagnosis Date   Allergic  rhinitis    Allergy    Asthma    Chronic cough    Hypertension     Tobacco History: Social History   Tobacco Use  Smoking Status Never  Smokeless Tobacco Never   Counseling given: Not Answered   Outpatient Medications Prior to Visit  Medication Sig Dispense Refill   amLODipine (NORVASC) 2.5 MG tablet Take 2.5 mg by mouth daily.     beclomethasone (QVAR) 80 MCG/ACT inhaler 11Take 2 puffs first thing in am and then another 2 puffs about 12 hours later.    1 Inhaler 11   famotidine (PEPCID) 20 MG tablet Take 20 mg by mouth at bedtime.       hydrochlorothiazide 25 MG tablet Take 25 mg by mouth daily.       meloxicam (MOBIC) 15 MG tablet Take 15 mg by mouth daily.     omeprazole (PRILOSEC) 40 MG capsule Take 1 capsule (40 mg total) by mouth daily. 30 capsule 11   OVER THE COUNTER MEDICATION Take 10 mg by mouth daily as needed. Zertec     OVER THE COUNTER MEDICATION Take 5 mg by mouth daily as needed. Xyzal     albuterol (VENTOLIN HFA) 108 (90 Base) MCG/ACT inhaler SMARTSIG:1-2 Puff(s) By Mouth Every 4-6 Hours PRN     fluticasone-salmeterol (ADVAIR HFA) 115-21 MCG/ACT inhaler Inhale 2 puffs into the lungs 2 (two) times daily. 1 each 5   montelukast (SINGULAIR) 10 MG tablet Take 1 tablet (10 mg total) by mouth at bedtime. 30 tablet 11  No facility-administered medications prior to visit.    Review of Systems  Review of Systems  Constitutional: Negative.   HENT:  Positive for postnasal drip.   Respiratory:  Negative for cough, chest tightness, shortness of breath and wheezing.     Physical Exam  BP 112/78   Pulse 79   Ht $R'5\' 9"'OT$  (1.753 m)   Wt 211 lb (95.7 kg)   SpO2 98%   BMI 31.16 kg/m  Physical Exam Constitutional:      Appearance: Normal appearance.  HENT:     Head: Normocephalic and atraumatic.     Mouth/Throat:     Mouth: Mucous membranes are moist.     Pharynx: Oropharynx is clear.  Cardiovascular:     Rate and Rhythm: Normal rate and regular rhythm.   Pulmonary:     Effort: Pulmonary effort is normal.     Breath sounds: Normal breath sounds. No wheezing, rhonchi or rales.     Comments: CTA Musculoskeletal:        General: Normal range of motion.  Skin:    General: Skin is warm and dry.  Neurological:     General: No focal deficit present.     Mental Status: He is alert and oriented to person, place, and time. Mental status is at baseline.  Psychiatric:        Mood and Affect: Mood normal.        Behavior: Behavior normal.        Thought Content: Thought content normal.        Judgment: Judgment normal.     Lab Results:  CBC    Component Value Date/Time   WBC 6.1 01/25/2014 1224   WBC 7.8 01/28/2010 1057   RBC 5.19 01/25/2014 1224   RBC 4.72 01/28/2010 1057   HGB 16.1 01/25/2014 1224   HGB 15.1 01/28/2010 1057   HCT 48.2 01/25/2014 1224   HCT 43.7 01/28/2010 1057   PLT 262.0 01/28/2010 1057   MCV 92.9 01/25/2014 1224   MCH 31.1 01/25/2014 1224   MCHC 33.4 01/25/2014 1224   MCHC 34.7 01/28/2010 1057   RDW 13.1 01/28/2010 1057   LYMPHSABS 2.1 01/28/2010 1057   MONOABS 0.4 01/28/2010 1057   EOSABS 0.0 01/28/2010 1057   BASOSABS 0.0 01/28/2010 1057    BMET    Component Value Date/Time   NA 136 01/25/2014 1212   K 3.7 01/25/2014 1212   CL 99 01/25/2014 1212   CO2 28 01/25/2014 1212   GLUCOSE 78 01/25/2014 1212   BUN 15 01/25/2014 1212   CREATININE 0.94 01/25/2014 1212   CALCIUM 9.4 01/25/2014 1212   GFRNONAA >60 01/14/2008 1355   GFRAA  01/14/2008 1355    >60        The eGFR has been calculated using the MDRD equation. This calculation has not been validated in all clinical    BNP No results found for: BNP  ProBNP No results found for: PROBNP  Imaging: No results found.   Assessment & Plan:   ASTHMA, UNSPECIFIED - Stable interval; No recent exacerbations requiring oral antibiotics or steroids. NO SABA use in the last year. He has some PND symptoms and rare nocturnal cough. He is only using  ICS inhaler once daily in the morning  - Methacholine challenge test 04/02/10> positive at 2nd dose but no symptoms  - Continue Qvar 1-2 puffs every 12 hours - Resume Singulair $RemoveBeforeDE'10mg'[id number]$  at bedtime, adding flonase nasal spray for PND contributing to nocturnal cough. Reinforced GERD diet.  -  FU in 1 year with Dr. Shearon Stalls or sooner if needed      Martyn Ehrich, NP 09/29/2020

## 2020-09-29 NOTE — Assessment & Plan Note (Addendum)
-   Stable interval; No recent exacerbations requiring oral antibiotics or steroids. NO SABA use in the last year. He has some PND symptoms and rare nocturnal cough. He is only using ICS inhaler once daily in the morning  - Methacholine challenge test 04/02/10> positive at 2nd dose but no symptoms  - Continue Qvar 1-2 puffs every 12 hours - Resume Singulair 10mg  at bedtime, adding flonase nasal spray for PND contributing to nocturnal cough. Reinforced GERD diet.  - FU in 1 year with Dr. or sooner if needed

## 2020-09-29 NOTE — Patient Instructions (Addendum)
Recommendations: - Continue Qvar - take 1-2 puffs morning AND evening (rinse mouth after use) - Use Albuterol rescue inhaler 2 puffs every 6 hours as needed for breakthrough shortness of breath/wheezing only  - Resume Singulair 10mg  at bedtime  - Try over the counter flonase once daily as needed for post nasal drip symptoms   Follow-up: 1 year with Dr. or sooner if needed     Asthma, Adult  Asthma is a long-term (chronic) condition in which the airways get tight and narrow. The airways are the breathing passages that lead from the nose and mouth down into the lungs. A person with asthma will have times when symptoms get worse. These are called asthma attacks. They can cause coughing, whistling sounds when you breathe (wheezing), shortness of breath, and chest pain. They can make it hard to breathe. Thereis no cure for asthma, but medicines and lifestyle changes can help control it. There are many things that can bring on an asthma attack or make asthma symptoms worse (triggers). Common triggers include: Mold. Dust. Cigarette smoke. Cockroaches. Things that can cause allergy symptoms (allergens). These include animal skin flakes (dander) and pollen from trees or grass. Things that pollute the air. These may include household cleaners, wood smoke, smog, or chemical odors. Cold air, weather changes, and wind. Crying or laughing hard. Stress. Certain medicines or drugs. Certain foods such as dried fruit, potato chips, and grape juice. Infections, such as a cold or the flu. Certain medical conditions or diseases. Exercise or tiring activities. Asthma may be treated with medicines and by staying away from the things that cause asthma attacks. Types of medicines may include: Controller medicines. These help prevent asthma symptoms. They are usually taken every day. Fast-acting reliever or rescue medicines. These quickly relieve asthma symptoms. They are used as needed and provide  short-term relief. Allergy medicines if your attacks are brought on by allergens. Medicines to help control the body's defense (immune) system. Follow these instructions at home: Avoiding triggers in your home Change your heating and air conditioning filter often. Limit your use of fireplaces and wood stoves. Get rid of pests (such as roaches and mice) and their droppings. Throw away plants if you see mold on them. Clean your floors. Dust regularly. Use cleaning products that do not smell. Have someone vacuum when you are not home. Use a vacuum cleaner with a HEPA filter if possible. Replace carpet with wood, tile, or vinyl flooring. Carpet can trap animal skin flakes and dust. Use allergy-proof pillows, mattress covers, and box spring covers. Wash bed sheets and blankets every week in hot water. Dry them in a dryer. Keep your bedroom free of any triggers. Avoid pets and keep windows closed when things that cause allergy symptoms are in the air. Use blankets that are made of polyester or cotton. Clean bathrooms and kitchens with bleach. If possible, have someone repaint the walls in these rooms with mold-resistant paint. Keep out of the rooms that are being cleaned and painted. Wash your hands often with soap and water. If soap and water are not available, use hand sanitizer. Do not allow anyone to smoke in your home. General instructions Take over-the-counter and prescription medicines only as told by your doctor. Talk with your doctor if you have questions about how or when to take your medicines. Make note if you need to use your medicines more often than usual. Do not use any products that contain nicotine or tobacco, such as cigarettes and e-cigarettes. If  you need help quitting, ask your doctor. Stay away from secondhand smoke. Avoid doing things outdoors when allergen counts are high and when air quality is low. Wear a ski mask when doing outdoor activities in the winter. The mask  should cover your nose and mouth. Exercise indoors on cold days if you can. Warm up before you exercise. Take time to cool down after exercise. Use a peak flow meter as told by your doctor. A peak flow meter is a tool that measures how well the lungs are working. Keep track of the peak flow meter's readings. Write them down. Follow your asthma action plan. This is a written plan for taking care of your asthma and treating your attacks. Make sure you get all the shots (vaccines) that your doctor recommends. Ask your doctor about a flu shot and a pneumonia shot. Keep all follow-up visits as told by your doctor. This is important. Contact a doctor if: You have wheezing, shortness of breath, or a cough even while taking medicine to prevent attacks. The mucus you cough up (sputum) is thicker than usual. The mucus you cough up changes from clear or white to yellow, green, gray, or bloody. You have problems from the medicine you are taking, such as: A rash. Itching. Swelling. Trouble breathing. You need reliever medicines more than 2-3 times a week. Your peak flow reading is still at 50-79% of your personal best after following the action plan for 1 hour. You have a fever. Get help right away if: You seem to be worse and are not responding to medicine during an asthma attack. You are short of breath even at rest. You get short of breath when doing very little activity. You have trouble eating, drinking, or talking. You have chest pain or tightness. You have a fast heartbeat. Your lips or fingernails start to turn blue. You are light-headed or dizzy, or you faint. Your peak flow is less than 50% of your personal best. You feel too tired to breathe normally. Summary Asthma is a long-term (chronic) condition in which the airways get tight and narrow. An asthma attack can make it hard to breathe. Asthma cannot be cured, but medicines and lifestyle changes can help control it. Make sure you  understand how to avoid triggers and how and when to use your medicines. This information is not intended to replace advice given to you by your health care provider. Make sure you discuss any questions you have with your healthcare provider. Document Revised: 06/27/2019 Document Reviewed: 06/27/2019 Elsevier Patient Education  2022 [employer].

## 2021-10-01 IMAGING — MR MR THORACIC SPINE W/O CM
4 of 6 series · 23 of 48 positions shown · non-contrast
Comparison: None.

CLINICAL DATA: Thoracic back pain.

EXAM:
MRI THORACIC SPINE WITHOUT CONTRAST
TECHNIQUE: Multiplanar, multisequence MR imaging of the thoracic spine was
performed. No intravenous contrast was administered.

[Series 17: T1 · sagittal · 3.0mm · 1.09mm/px · 5 of 19 slices shown]
[im 1/19]
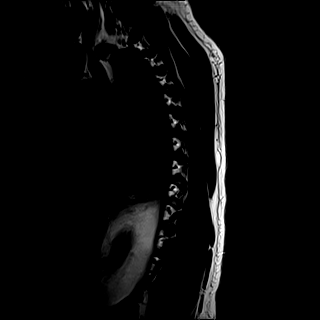
[im 4/19]
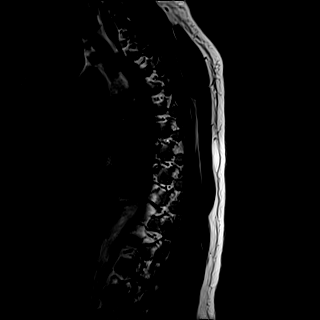
[im 8/19]
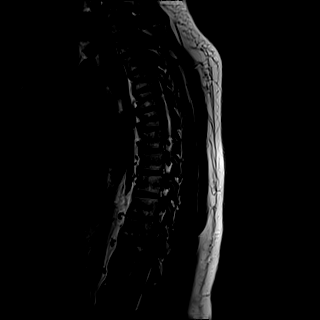
[im 11/19]
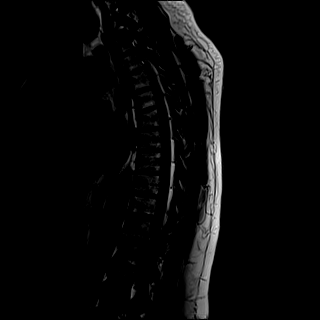
[im 19/19]
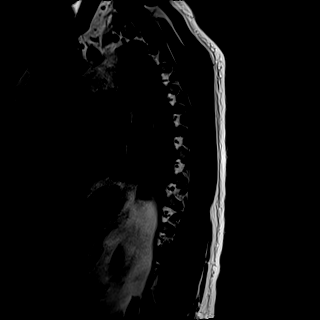

[Series 18: STIR · sagittal · 3.0mm · 1.09mm/px · 3 of 19 slices shown]
[im 4/19]
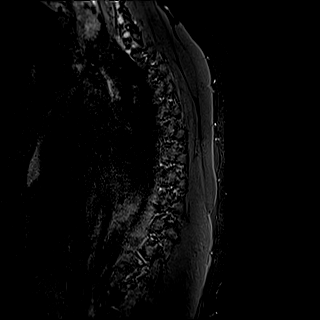
[im 11/19]
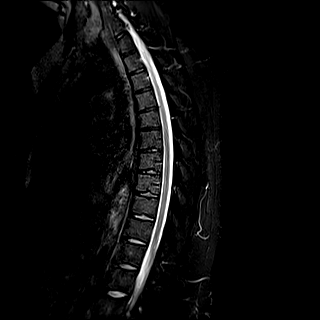
[im 19/19]
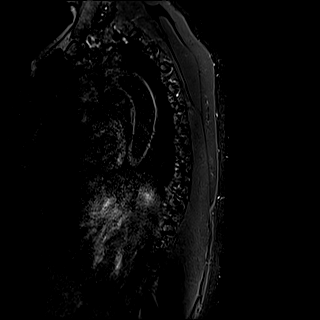

[Series 19: T2 · sagittal · 3.0mm · 0.91mm/px · 6 of 19 slices shown (1 of 2)]
[im 1/19]
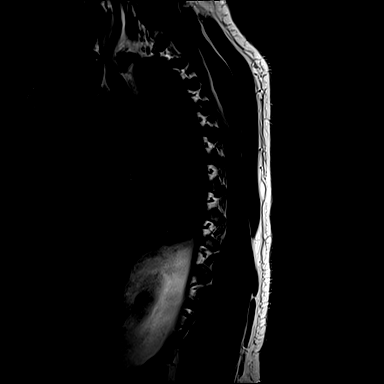
[im 4/19]
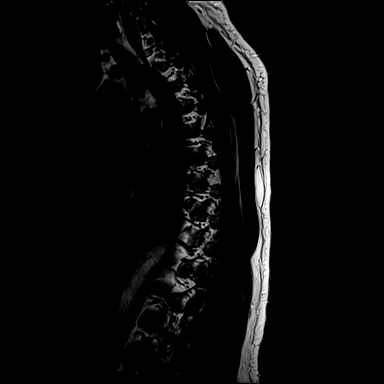
[im 8/19]
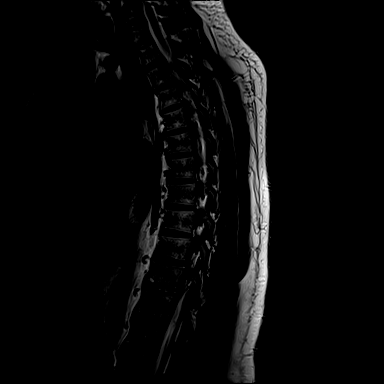
[im 11/19]
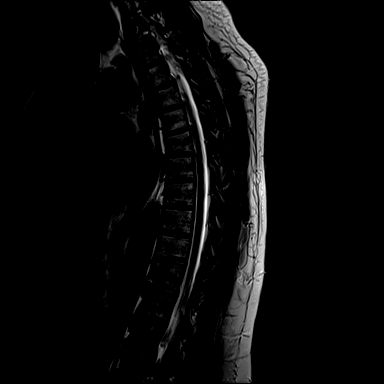
[im 15/19]
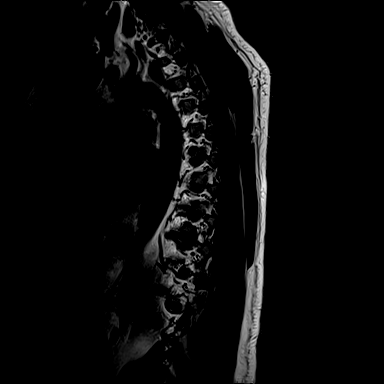
[im 19/19]
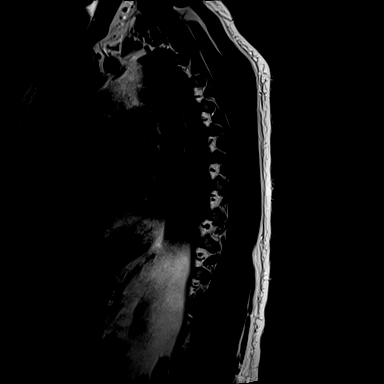

[Series 20: T2 · axial · 4.0mm · 0.28mm/px · z∈[-341,-94]mm · 9 of 39 slices shown (2 of 2)]
[im 1/39]
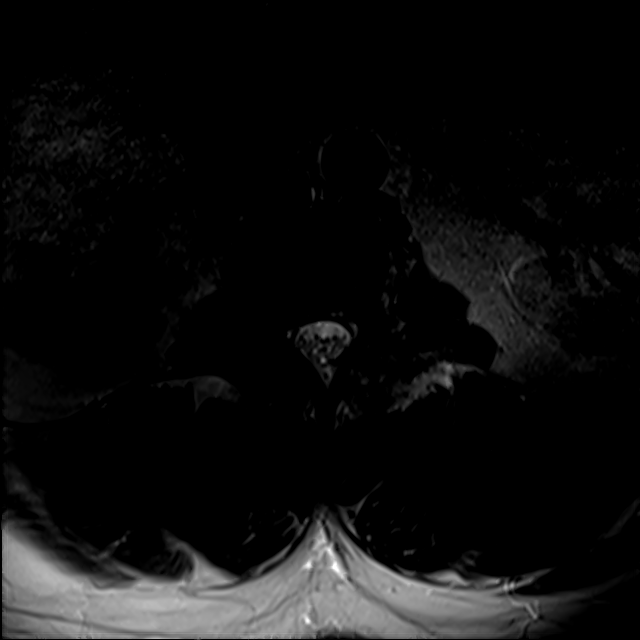
[im 7/39]
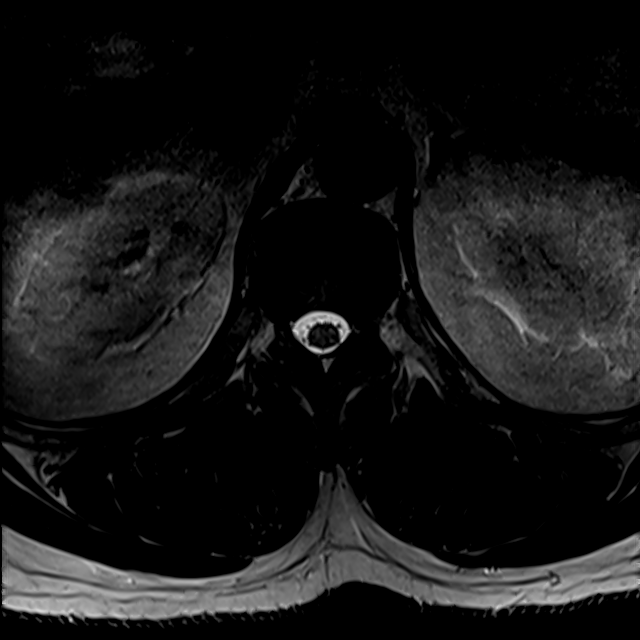
[im 13/39]
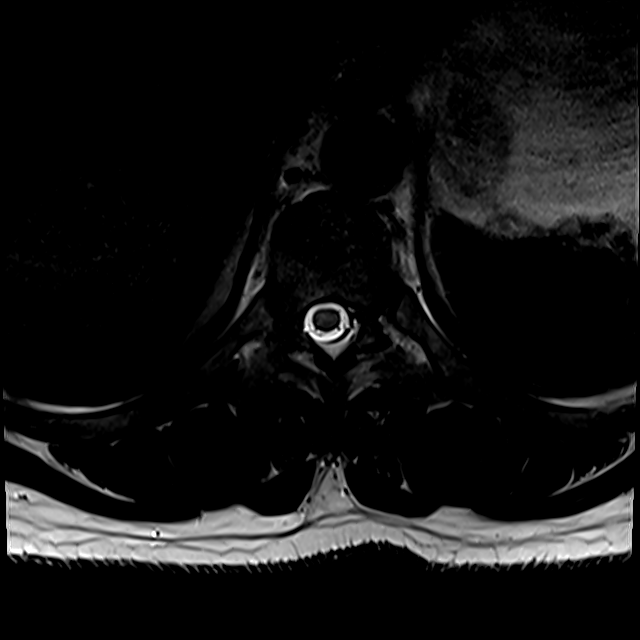
[im 16/39]
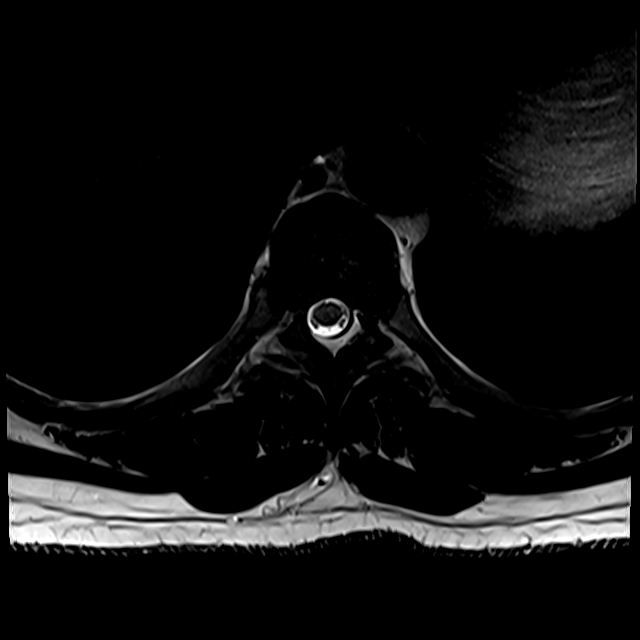
[im 20/39]
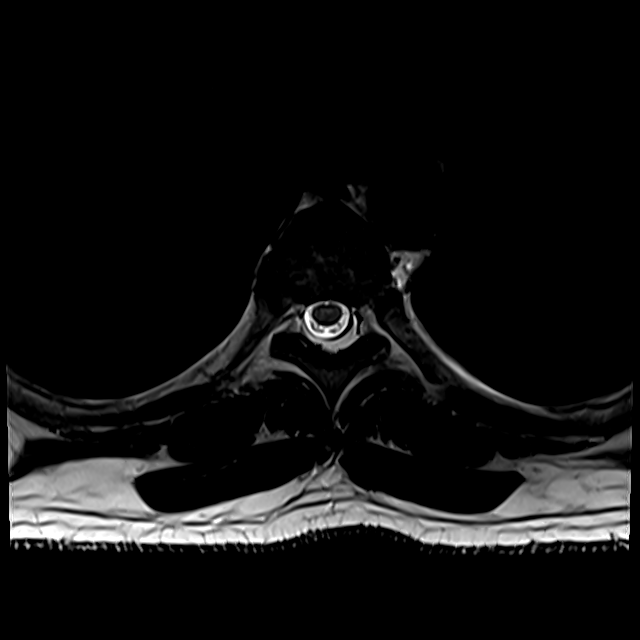
[im 23/39]
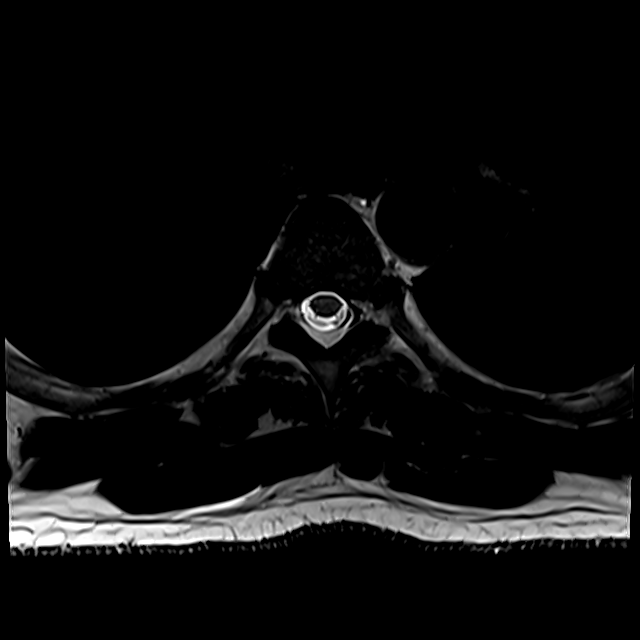
[im 26/39]
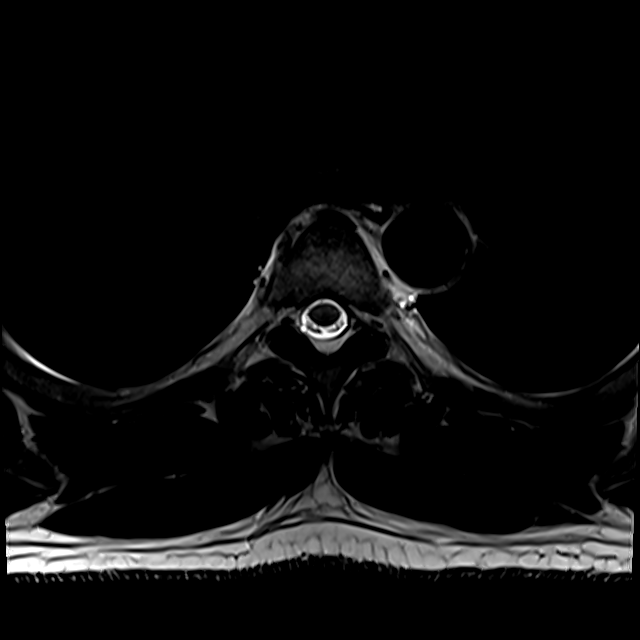
[im 32/39]
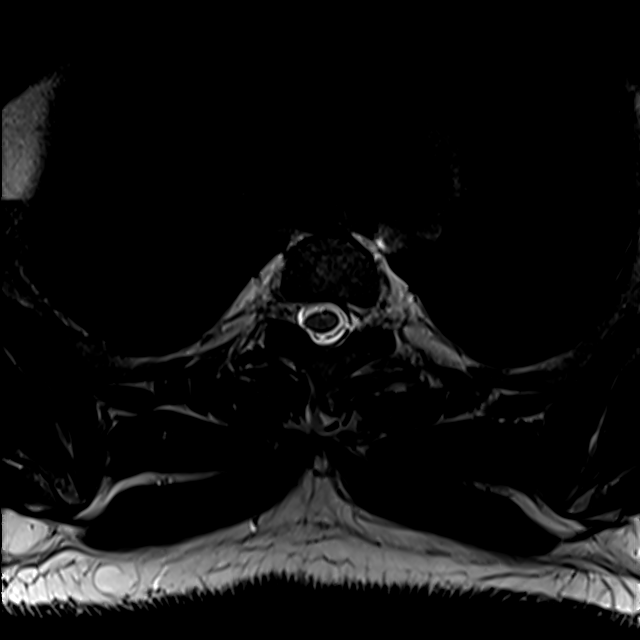
[im 39/39]
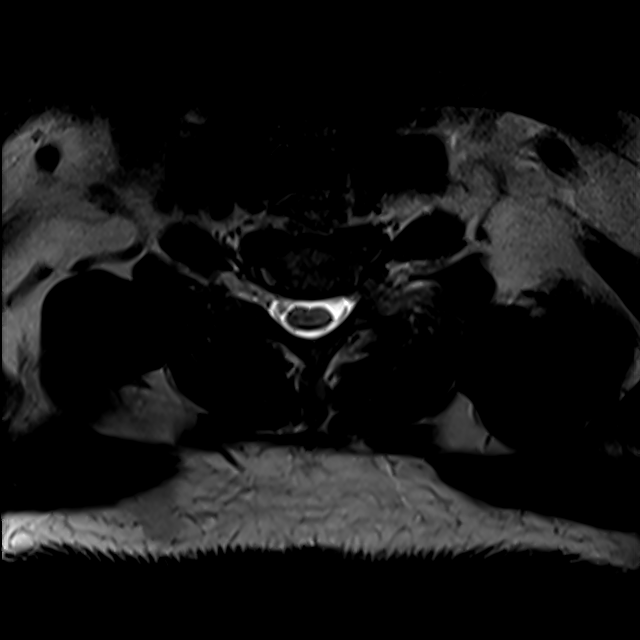

[23 of 48 positions shown; findings below may reference images not displayed]

FINDINGS: Alignment:  Normal.

Vertebrae: Vertebral body heights are maintained. No specific
evidence of acute fracture, discitis/osteomyelitis, or suspicious
bone lesion. Small degenerative Schmorl's nodes at multiple levels.

Cord:  Normal cord signal and morphology

Paraspinal and other soft tissues: Left renal cyst. Otherwise,
unremarkable.

Disc levels:

Mild multilevel facet arthropathy and mildly prominent dorsal
epidural fat. No significant canal or foraminal stenosis.
IMPRESSION: Mild multilevel degenerative change without significant canal or
foraminal stenosis.

## 2021-11-04 ENCOUNTER — Telehealth: Payer: Self-pay | Admitting: Primary Care

## 2021-11-04 NOTE — Telephone Encounter (Signed)
Patient is calling to request a refill on his HFA Advair inhaler from the [facility] on [address].  He stated that he is completely out of medication.  Please advise.

## 2021-11-06 MED ORDER — FLUTICASONE-SALMETEROL 115-21 MCG/ACT IN AERO: 2.0000 | INHALATION_SPRAY | Freq: Two times a day (BID) | RESPIRATORY_TRACT | 1 refills | Status: DC

## 2021-11-06 NOTE — Telephone Encounter (Signed)
We can refill Advair. Remove Qvar. If we want to de-escalate therapy that can be discuss at his follow up with Dr. Celine Mans in 2 weeks.

## 2021-11-06 NOTE — Telephone Encounter (Signed)
Spoke with the pt  He is asking for a refill on advair hfa  Last ov he was advised to take qvar, and the advair was removed from his list  He states he does not think he ever used qvar and has continued to be on advair  Has appt pending for 11/23/21 Please advise, thanks

## 2021-11-06 NOTE — Telephone Encounter (Signed)
I called the patient and he will keep taking the Advair and talk to the provider at the next visit. Nothing further needed.

## 2021-11-06 NOTE — Telephone Encounter (Signed)
Pt has not been seen in over a year so will need an appt prior to receiving med refills.  Attempted to call pt but unable to reach. Left message for him to return call.

## 2021-11-06 NOTE — Telephone Encounter (Signed)
Patient scheduled 11/23/2021 with Dr. Celine Mans. Patient would like refill called into pharmacy. Patient phone number is [phone].

## 2021-11-23 ENCOUNTER — Ambulatory Visit (INDEPENDENT_AMBULATORY_CARE_PROVIDER_SITE_OTHER): Payer: PRIVATE HEALTH INSURANCE | Admitting: Internal Medicine

## 2021-11-23 ENCOUNTER — Encounter: Payer: Self-pay | Admitting: Internal Medicine

## 2021-11-23 VITALS — BP 138/80 | HR 60 | Temp 98.0°F | Ht 69.0 in | Wt 210.6 lb

## 2021-11-23 DIAGNOSIS — K219 Gastro-esophageal reflux disease without esophagitis: Secondary | ICD-10-CM

## 2021-11-23 DIAGNOSIS — J301 Allergic rhinitis due to pollen: Secondary | ICD-10-CM

## 2021-11-23 DIAGNOSIS — J4530 Mild persistent asthma, uncomplicated: Secondary | ICD-10-CM | POA: Diagnosis not present

## 2021-11-23 MED ORDER — FLUTICASONE-SALMETEROL 115-21 MCG/ACT IN AERO: 1.0000 | INHALATION_SPRAY | Freq: Two times a day (BID) | RESPIRATORY_TRACT | 11 refills | Status: DC

## 2021-11-23 MED ORDER — ALBUTEROL SULFATE HFA 108 (90 BASE) MCG/ACT IN AERS: INHALATION_SPRAY | RESPIRATORY_TRACT | 5 refills | Status: AC

## 2021-11-23 NOTE — Progress Notes (Signed)
Alexander Mclean    [id number]    1966-02-25  Primary Care Physician:Swayne, Onalee Hua, MD Date of Appointment: 11/23/2021 Established Patient Visit  Chief complaint:   Chief Complaint  Patient presents with   Follow-up    No c/o      HPI: Alexander Mclean is a 56 y.o. man with asthma and chronic cough.  Interval Updates: Presents for follow up. I have not seen him in over 2 years. He was seen about a year ago by BW.  No interval hospitalizations or ED visits. No thrush or adverse effects.   Has occasional cough when he drinks coffee.   Current Regimen: advair BID, prn albuterol (use is minimal) Asthma Triggers: fan, smoke, kitchen smells Exacerbations in the last year: zero History of hospitalization or intubation: none Hives: denies Allergy Testing: never had GERD: yes but mild and intermittent.  Allergic Rhinitis: was previously on singulair, xyzal, not needing these.  ACT:  Asthma Control Test ACT Total Score  09/29/2020  9:58 AM 24  09/29/2020  9:32 AM 24  07/20/2019 10:27 AM 9   FeNO: 11 ppb  I have reviewed the patient's family social and past medical history and updated as appropriate.   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  Gen:      No acute distress Lungs:    ctab no wheezes or crackles CV:         Regular rate and rhythm; no murmurs, rubs, or gallops.  No pedal edema   Data Reviewed: Imaging: I have personally reviewed the chest xray April 2021 No acute cardiopulmonary process  PFTs: I have personally reviewed the patient's PFTs and spirometry today shows no airflow limitation, no significant BD response  Labs: Exhaled NO is 11 ppb today  Immunization status: Immunization History  Administered Date(s) Administered   Influenza Whole 12/06/2009   Influenza,inj,Quad PF,6+ Mos 01/06/2019   Moderna Sars-Covid-2 Vaccination 06/12/2019, 07/11/2019    Assessment:  Mild persistent asthma - controlled.  Allergic rhinitis, controlled off therapy GERD - controlled  Plan/Recommendations: Can decrease advair to 1 puff twice a day.  Take the albuterol rescue inhaler every 4 to 6 hours as needed for wheezing or shortness of breath. You can also take it 15 minutes before exercise or exertional activity. Side effects include heart racing or pounding, jitters or anxiety. If you have a history of an irregular heart rhythm, it can make  this worse. Can also give some patients a hard time sleeping.  You may need to resume allergy medications if those symptoms return - allergies can worsen asthma control.  Call us sooner if any issues or concerns with your breathing.     Return to Care: Return in about 1 year (around 11/24/2022).   Lenice Llamas, MD Pulmonary and [place]

## 2021-11-23 NOTE — Patient Instructions (Signed)
Please schedule follow up scheduled with myself in 1 year.  If my schedule is not open yet, we will contact you with a reminder closer to that time. Please call [phone] if you haven't heard from [place] a month before.   Can decrease advair to 1 puff twice a day.  Take the albuterol rescue inhaler every 4 to 6 hours as needed for wheezing or shortness of breath. You can also take it 15 minutes before exercise or exertional activity. Side effects include heart racing or pounding, jitters or anxiety. If you have a history of an irregular heart rhythm, it can make this worse. Can also give some patients a hard time sleeping.  You may need to resume allergy medications if those symptoms return - allergies can worsen asthma control.  Call us sooner if any issues or concerns with your breathing.

## 2023-01-27 ENCOUNTER — Telehealth: Payer: Self-pay | Admitting: Primary Care

## 2023-01-27 NOTE — Telephone Encounter (Signed)
PT has not been seen in a year. Needs Advair refill. Resistant to making FU appt but I made clear needs to be seen in order to continue medication.    Pharm is [facility] on [address] (?)  His [phone]

## 2023-02-01 MED ORDER — FLUTICASONE-SALMETEROL 115-21 MCG/ACT IN AERO: 1.0000 | INHALATION_SPRAY | Freq: Two times a day (BID) | RESPIRATORY_TRACT | 11 refills | Status: DC

## 2023-02-01 NOTE — Telephone Encounter (Signed)
Pt needs his Advair refilled

## 2023-02-01 NOTE — Telephone Encounter (Signed)
Called and spoke with pt about sending in Advair, he confirmed pharmacy. Nfn

## 2023-02-08 ENCOUNTER — Ambulatory Visit: Payer: PRIVATE HEALTH INSURANCE | Admitting: Internal Medicine

## 2023-04-18 ENCOUNTER — Ambulatory Visit: Payer: PRIVATE HEALTH INSURANCE | Admitting: Internal Medicine

## 2023-04-29 ENCOUNTER — Encounter: Payer: Self-pay | Admitting: Internal Medicine

## 2023-05-25 ENCOUNTER — Other Ambulatory Visit: Payer: Self-pay | Admitting: Family Medicine

## 2023-05-25 DIAGNOSIS — R1032 Left lower quadrant pain: Secondary | ICD-10-CM

## 2023-06-01 ENCOUNTER — Other Ambulatory Visit: Payer: Self-pay

## 2023-10-19 ENCOUNTER — Other Ambulatory Visit: Payer: Self-pay | Admitting: Family Medicine

## 2023-10-19 ENCOUNTER — Ambulatory Visit
Admission: RE | Admit: 2023-10-19 | Discharge: 2023-10-19 | Disposition: A | Discharge status: Home / Self Care | Payer: PRIVATE HEALTH INSURANCE | Source: Ambulatory Visit | Attending: Family Medicine | Admitting: Family Medicine

## 2023-10-19 DIAGNOSIS — R109 Unspecified abdominal pain: Secondary | ICD-10-CM

## 2023-10-19 MED ORDER — IOPAMIDOL (ISOVUE-300) INJECTION 61%
100.0000 mL | Freq: Once | INTRAVENOUS | Status: AC | PRN
  Administered 2023-10-19 (×2): 100 mL via INTRAVENOUS

## 2023-10-25 ENCOUNTER — Emergency Department (HOSPITAL_BASED_OUTPATIENT_CLINIC_OR_DEPARTMENT_OTHER)
Admission: EM | Admit: 2023-10-25 | Discharge: 2023-10-25 | Disposition: A | Discharge status: Home / Self Care | Payer: PRIVATE HEALTH INSURANCE | Attending: Emergency Medicine | Admitting: Emergency Medicine

## 2023-10-25 ENCOUNTER — Encounter (HOSPITAL_BASED_OUTPATIENT_CLINIC_OR_DEPARTMENT_OTHER): Payer: Self-pay

## 2023-10-25 ENCOUNTER — Emergency Department (HOSPITAL_BASED_OUTPATIENT_CLINIC_OR_DEPARTMENT_OTHER): Payer: PRIVATE HEALTH INSURANCE

## 2023-10-25 ENCOUNTER — Other Ambulatory Visit: Payer: Self-pay

## 2023-10-25 DIAGNOSIS — I10 Essential (primary) hypertension: Secondary | ICD-10-CM | POA: Insufficient documentation

## 2023-10-25 DIAGNOSIS — R110 Nausea: Secondary | ICD-10-CM | POA: Diagnosis not present

## 2023-10-25 DIAGNOSIS — J45909 Unspecified asthma, uncomplicated: Secondary | ICD-10-CM | POA: Insufficient documentation

## 2023-10-25 DIAGNOSIS — R202 Paresthesia of skin: Secondary | ICD-10-CM | POA: Insufficient documentation

## 2023-10-25 DIAGNOSIS — Z7951 Long term (current) use of inhaled steroids: Secondary | ICD-10-CM | POA: Insufficient documentation

## 2023-10-25 DIAGNOSIS — Z79899 Other long term (current) drug therapy: Secondary | ICD-10-CM | POA: Diagnosis not present

## 2023-10-25 DIAGNOSIS — M546 Pain in thoracic spine: Secondary | ICD-10-CM | POA: Insufficient documentation

## 2023-10-25 DIAGNOSIS — R200 Anesthesia of skin: Secondary | ICD-10-CM | POA: Diagnosis present

## 2023-10-25 DIAGNOSIS — R42 Dizziness and giddiness: Secondary | ICD-10-CM | POA: Diagnosis not present

## 2023-10-25 DIAGNOSIS — R197 Diarrhea, unspecified: Secondary | ICD-10-CM | POA: Diagnosis not present

## 2023-10-25 HISTORY — DX: Diverticulosis of intestine, part unspecified, without perforation or abscess without bleeding: K57.90

## 2023-10-25 LAB — CBC
HCT: 40.6 % (ref 39.0–52.0)
Hemoglobin: 14.4 g/dL (ref 13.0–17.0)
MCH: 31.4 pg (ref 26.0–34.0)
MCHC: 35.5 g/dL (ref 30.0–36.0)
MCV: 88.5 fL (ref 80.0–100.0)
Platelets: 296 K/uL (ref 150–400)
RBC: 4.59 MIL/uL (ref 4.22–5.81)
RDW: 12.6 % (ref 11.5–15.5)
WBC: 8.8 K/uL (ref 4.0–10.5)
nRBC: 0 % (ref 0.0–0.2)

## 2023-10-25 LAB — DIFFERENTIAL
Abs Immature Granulocytes: 0.02 K/uL (ref 0.00–0.07)
Basophils Absolute: 0.1 K/uL (ref 0.0–0.1)
Basophils Relative: 1 %
Eosinophils Absolute: 0.1 K/uL (ref 0.0–0.5)
Eosinophils Relative: 1 %
Immature Granulocytes: 0 %
Lymphocytes Relative: 38 %
Lymphs Abs: 3.3 K/uL (ref 0.7–4.0)
Monocytes Absolute: 0.7 K/uL (ref 0.1–1.0)
Monocytes Relative: 8 %
Neutro Abs: 4.5 K/uL (ref 1.7–7.7)
Neutrophils Relative %: 52 %

## 2023-10-25 LAB — COMPREHENSIVE METABOLIC PANEL WITH GFR
ALT: 44 U/L (ref 0–44)
AST: 37 U/L (ref 15–41)
Albumin: 4.5 g/dL (ref 3.5–5.0)
Alkaline Phosphatase: 58 U/L (ref 38–126)
Anion gap: 12 (ref 5–15)
BUN: 11 mg/dL (ref 6–20)
CO2: 26 mmol/L (ref 22–32)
Calcium: 9.3 mg/dL (ref 8.9–10.3)
Chloride: 104 mmol/L (ref 98–111)
Creatinine, Ser: 1.03 mg/dL (ref 0.61–1.24)
GFR, Estimated: >60 mL/min (ref >60)
Glucose, Bld: 98 mg/dL (ref 70–99)
Potassium: 3.5 mmol/L (ref 3.5–5.1)
Sodium: 141 mmol/L (ref 135–145)
Total Bilirubin: 0.4 mg/dL (ref 0.0–1.2)
Total Protein: 7.6 g/dL (ref 6.5–8.1)

## 2023-10-25 LAB — APTT: aPTT: 29 s (ref 24–36)

## 2023-10-25 LAB — PROTIME-INR
INR: 1 (ref 0.8–1.2)
Prothrombin Time: 14.2 s (ref 11.4–15.2)

## 2023-10-25 LAB — TROPONIN T, HIGH SENSITIVITY
Troponin T High Sensitivity: <15 ng/L (ref 0–19)
Troponin T High Sensitivity: <15 ng/L (ref 0–19)

## 2023-10-25 LAB — CBG MONITORING, ED: Glucose-Capillary: 109 mg/dL — ABNORMAL HIGH (ref 70–99)

## 2023-10-25 LAB — ETHANOL: Alcohol, Ethyl (B): <15 mg/dL (ref <15)

## 2023-10-25 MED ORDER — SODIUM CHLORIDE 0.9 % IV BOLUS
1000.0000 mL | Freq: Once | INTRAVENOUS | Status: AC
  Administered 2023-10-25: 1000 mL via INTRAVENOUS

## 2023-10-25 MED ORDER — SODIUM CHLORIDE 0.9% FLUSH
3.0000 mL | Freq: Once | INTRAVENOUS | Status: AC
  Administered 2023-10-25: 3 mL via INTRAVENOUS
  Filled 2023-10-25: qty 3

## 2023-10-25 NOTE — ED Notes (Signed)
 EDP requested second trop. The first trop was complete but was not transferring over to Epic. A second Ethanol specimen was obtained due to hemolysis, and I called lab to have the trop run off that sample.

## 2023-10-25 NOTE — ED Triage Notes (Signed)
 Pt reports that he has been having some right arm and facial numbness. Alert and oriented x 4. States that numbness is constant. States that he was at work when he noted it happening at around 1pm.  States that he is dizzy and has some nausea.

## 2023-10-25 NOTE — ED Notes (Signed)
 The pt just finished a antbx script from 8/9 when he was diagnosed with diverticulitis. The pt advised he's had bloat and diarrhea since starting the meds. The pt denies bloody discharge.  He also complained of R arm tingling and facial tingling, but it comes and goes. No recent trauma, no recent illness, and no acute trauma. Pt had routine coughing from asthma and allergy.

## 2023-10-25 NOTE — ED Notes (Signed)
 Called lab per EDP request. Troponin not showing in process.

## 2023-10-25 NOTE — ED Notes (Signed)
 Pt ambulated to bathroom without incident. He feels notably better and walked without an incident. Still has some ringing in his ears that is not worse nor better yet.

## 2023-10-25 NOTE — ED Provider Notes (Signed)
 [facility] Provider Note   CSN: [id number] Arrival date & time: 10/25/23  1725     Patient presents with: Numbness   Alexander Mclean is a 58 y.o. male.   Patient is a 58 year old male who presents with dizziness and numbness.  Patient has a history of hypertension.  He was recently diagnosed with diverticulitis.  He has just completed a course of antibiotics (Cipro, Flagyl) today.  He states that he was driving his car around and he smelled a little bit of an odor.  When he got out, he felt a little dizzy and lightheaded.  He has some ringing in both of his ears.  He felt tingly in both hands and on his face.  He still feels a little lightheaded.  Denies any weakness in his extremities.  No speech deficits or vision changes.  No fevers.  He said he still has not felt completely back to normal.  His had some ongoing loose stools.  His abdominal pain has improved.  No urinary symptoms.  He had a little bit of chest discomfort today.  No shortness of breath.  He has had some intermittent pain in his back.  It is upper back but it comes and goes.  No persistent pain.       Prior to Admission medications   Medication Sig Start Date End Date Taking? Authorizing Provider  albuterol  (VENTOLIN  HFA) 108 (90 Base) MCG/ACT inhaler SMARTSIG:1-2 Puff(s) By Mouth Every 4-6 Hours PRN 11/23/21   Desai, Nikita S, MD  amLODipine (NORVASC) 2.5 MG tablet Take 2.5 mg by mouth daily.    [provider]  fluticasone -salmeterol (ADVAIR  HFA) 115-21 MCG/ACT inhaler Inhale 1 puff into the lungs 2 (two) times daily. 02/01/23   Hope Almarie ORN, NP  hydrochlorothiazide 25 MG tablet Take 25 mg by mouth daily.      [provider]  meloxicam (MOBIC) 15 MG tablet Take 15 mg by mouth daily. 06/19/19   [provider]  montelukast  (SINGULAIR ) 10 MG tablet Take 1 tablet (10 mg total) by mouth at bedtime. 09/29/20   Hope Almarie ORN, NP     Allergies: Patient has no known allergies.    Review of Systems  Constitutional:  Positive for fatigue. Negative for chills, diaphoresis and fever.  HENT:  Negative for congestion, rhinorrhea and sneezing.   Eyes: Negative.   Respiratory:  Positive for chest tightness. Negative for cough and shortness of breath.   Cardiovascular:  Negative for leg swelling.  Gastrointestinal:  Positive for abdominal pain, diarrhea and nausea. Negative for blood in stool and vomiting.  Genitourinary:  Negative for difficulty urinating, flank pain, frequency and hematuria.  Musculoskeletal:  Positive for back pain. Negative for arthralgias.  Skin:  Negative for rash.  Neurological:  Positive for dizziness, light-headedness and numbness. Negative for speech difficulty, weakness and headaches.    Updated Vital Signs BP (!) 142/100   Pulse 82   Temp 97.8 F (36.6 C)   Resp 15   Ht 5' 9 (1.753 m)   Wt 97.5 kg   SpO2 100%   BMI 31.75 kg/m   Physical Exam Constitutional:      Appearance: He is well-developed.  HENT:     Head: Normocephalic and atraumatic.  Eyes:     Pupils: Pupils are equal, round, and reactive to light.  Cardiovascular:     Rate and Rhythm: Normal rate and regular rhythm.     Heart sounds: Normal heart  sounds.  Pulmonary:     Effort: Pulmonary effort is normal. No respiratory distress.     Breath sounds: Normal breath sounds. No wheezing or rales.  Chest:     Chest wall: No tenderness.  Abdominal:     General: Bowel sounds are normal.     Palpations: Abdomen is soft.     Tenderness: There is no abdominal tenderness. There is no guarding or rebound.  Musculoskeletal:        General: Normal range of motion.     Cervical back: Normal range of motion and neck supple.  Lymphadenopathy:     Cervical: No cervical adenopathy.  Skin:    General: Skin is warm and dry.     Findings: No rash.  Neurological:     General: No focal deficit present.     Mental Status: He is  alert and oriented to person, place, and time.     Comments: Motor 5/5 all extremities Sensation grossly intact to LT all extremities Finger to Nose intact, no pronator drift CN II-XII grossly intact Gait normal      (all labs ordered are listed, but only abnormal results are displayed) Labs Reviewed  CBG MONITORING, ED - Abnormal; Notable for the following components:      Result Value   Glucose-Capillary 109 (*)    All other components within normal limits  PROTIME-INR  APTT  CBC  DIFFERENTIAL  COMPREHENSIVE METABOLIC PANEL WITH GFR  ETHANOL  TROPONIN T, HIGH SENSITIVITY  TROPONIN T, HIGH SENSITIVITY    EKG: EKG Interpretation Date/Time:  Tuesday October 25 2023 18:23:02 EDT Ventricular Rate:  57 PR Interval:  196 QRS Duration:  92 QT Interval:  421 QTC Calculation: 410 R Axis:   13  Text Interpretation: Sinus rhythm Abnormal R-wave progression, early transition Borderline T abnormalities, diffuse leads since last tracing no significant change Confirmed by Lenor Hollering [id number]) on 10/25/2023 9:33:02 PM  Radiology: CT HEAD WO CONTRAST Result Date: 10/25/2023 CLINICAL DATA:  Numbness or tingling, paresthesia (Ped 0-17y) arm numbness EXAM: CT HEAD WITHOUT CONTRAST TECHNIQUE: Contiguous axial images were obtained from the base of the skull through the vertex without intravenous contrast. RADIATION DOSE REDUCTION: This exam was performed according to the departmental dose-optimization program which includes automated exposure control, adjustment of the mA and/or kV according to patient size and/or use of iterative reconstruction technique. COMPARISON:  None Available. FINDINGS: Brain: No evidence of acute infarction, hemorrhage, hydrocephalus, extra-axial collection or mass lesion/mass effect. Vascular: No hyperdense vessel. Skull: No acute fracture. Sinuses/Orbits: Clear sinuses.  No acute orbital findings. Other: No mastoid effusions. IMPRESSION: No evidence of acute  intracranial abnormality. Electronically Signed   By: Gilmore GORMAN Molt M.D.   On: 10/25/2023 18:14     Procedures   Medications Ordered in the ED  sodium chloride  flush (NS) 0.9 % injection 3 mL (3 mLs Intravenous Given 10/25/23 1830)  sodium chloride  0.9 % bolus 1,000 mL (0 mLs Intravenous Stopped 10/25/23 2015)                                    Medical Decision Making Amount and/or Complexity of Data Reviewed Labs: ordered. Radiology: ordered.   This patient presents to the ED for concern of dizziness, numbness, this involves an extensive number of treatment options, and is a complaint that carries with it a high risk of complications and morbidity.  I considered the following differential and  admission for this acute, potentially life threatening condition.  The differential diagnosis includes stroke, ACS, vertigo, aortic dissection, anemia, dehydration, electrolyte abnormality, infection, arrhythmia  MDM:    Patient is a 58 year old who presents with lightheadedness and some paresthesias.  He is currently being treated for diverticulitis.  Overall his diverticulitis symptoms have improved but he still has some nausea and diarrhea.  He does not have any focal neurologic deficits or clinical concerns for stroke.  Head CT does not show any acute abnormality.  His EKG does not show any ischemic changes or arrhythmias.  His troponin is negative.  Other labs are nonconcerning.  He was given IV fluids and is feeling much better.  He is able to ambulate without any symptoms.  No ongoing chest pain, he had some ringing in his ears that resolved.  He was discharged home in good condition.  He was encouraged to follow-up with his PCP.  Return precautions were given.  His blood pressure was mildly elevated and he was advised to have this rechecked by his primary care doctor.  (Labs, imaging, consults)  Labs: I Ordered, and personally interpreted labs.  The pertinent results include: Normal white  count, no anemia, electrolytes nonconcerning  Imaging Studies ordered: I ordered imaging studies including head CT I independently visualized and interpreted imaging. I agree with the radiologist interpretation  Additional history obtained from chart.  External records from outside source obtained and reviewed including prior note  Cardiac Monitoring: The patient was maintained on a cardiac monitor.  If on the cardiac monitor, I personally viewed and interpreted the cardiac monitored which showed an underlying rhythm of: Sinus rhythm  Reevaluation: After the interventions noted above, I reevaluated the patient and found that they have :improved  Social Determinants of Health:    Disposition: Discharged to home  Co morbidities that complicate the patient evaluation  Past Medical History:  Diagnosis Date   Allergic rhinitis    Allergy    Asthma    Chronic cough    Diverticulosis    Hypertension      Medicines Meds ordered this encounter  Medications   sodium chloride  flush (NS) 0.9 % injection 3 mL   sodium chloride  0.9 % bolus 1,000 mL    I have reviewed the patients home medicines and have made adjustments as needed  Problem List / ED Course: Problem List Items Addressed This Visit   None Visit Diagnoses       Paresthesias    -  Primary     Dizziness                    Final diagnoses:  Paresthesias  Dizziness    ED Discharge Orders     None          Lenor Hollering, MD 10/25/23 2135

## 2023-10-25 NOTE — Discharge Instructions (Addendum)
 Make sure that you are drinking lots of fluids.  Your blood pressure is mildly elevated and should be rechecked by your primary care doctor.  Return to the emergency room if you have any worsening symptoms.

## 2024-01-18 ENCOUNTER — Other Ambulatory Visit: Payer: Self-pay | Admitting: Urology

## 2024-02-01 ENCOUNTER — Encounter (HOSPITAL_COMMUNITY): Admission: RE | Admit: 2024-02-01 | Payer: Self-pay | Source: Ambulatory Visit

## 2024-02-13 ENCOUNTER — Ambulatory Visit (HOSPITAL_COMMUNITY): Admit: 2024-02-13 | Payer: Self-pay | Admitting: Urology

## 2024-02-13 SURGERY — DESTRUCTION, LESION, PENIS
Anesthesia: General

## 2024-03-22 ENCOUNTER — Other Ambulatory Visit: Payer: Self-pay | Admitting: Primary Care

## 2024-03-27 ENCOUNTER — Other Ambulatory Visit: Payer: Self-pay

## 2024-03-27 MED ORDER — FLUTICASONE-SALMETEROL 115-21 MCG/ACT IN AERO: 1.0000 | INHALATION_SPRAY | Freq: Two times a day (BID) | RESPIRATORY_TRACT | 0 refills | Status: DC

## 2024-03-27 NOTE — Telephone Encounter (Signed)
 Copied from CRM [id number]. Topic: Clinical - Medication Refill >> Mar 26, 2024  2:34 PM Rozanna G wrote: Medication: fluticasone -salmeterol (ADVAIR  HFA) 115-21 MCG/ACT inhaler  Has the patient contacted their pharmacy? No (Agent: If no, request that the patient contact the pharmacy for the refill. If patient does not wish to contact the pharmacy document the reason why and proceed with request.) (Agent: If yes, when and what did the pharmacy advise?)  This is the patient's preferred pharmacy:  [facility] ST[facility] #[id number] [address], [place] - [address] AT [facility] [address] [place] [address] Phone: [phone] Fax: [phone]  Is this the correct pharmacy for this prescription? Yes If no, delete pharmacy and type the correct one.   Has the prescription been filled recently? Yes  Is the patient out of the medication? No,pharmacy tech calling not sure  Has the patient been seen for an appointment in the last year OR does the patient have an upcoming appointment? No  Can we respond through MyChart? No, not srue pharmacy tech calling   Agent: Please be advised that Rx refills may take up to 3 business days. We ask that you follow-up with your pharmacy.    Called and spoke to pt. Pt has been scheduled for a virtual visit for med refill and was given one courtesy refill of Advair . Pt was advised that he must keep appt for any further refills. Pt verbalized understanding, NFN

## 2024-03-30 ENCOUNTER — Telehealth: Payer: PRIVATE HEALTH INSURANCE | Admitting: Adult Health

## 2024-03-30 ENCOUNTER — Telehealth: Payer: Self-pay | Admitting: Primary Care

## 2024-03-30 ENCOUNTER — Telehealth: Payer: Self-pay

## 2024-03-30 ENCOUNTER — Encounter: Payer: Self-pay | Admitting: Adult Health

## 2024-03-30 DIAGNOSIS — R053 Chronic cough: Secondary | ICD-10-CM | POA: Diagnosis not present

## 2024-03-30 DIAGNOSIS — Z8709 Personal history of other diseases of the respiratory system: Secondary | ICD-10-CM | POA: Diagnosis not present

## 2024-03-30 DIAGNOSIS — J4531 Mild persistent asthma with (acute) exacerbation: Secondary | ICD-10-CM | POA: Diagnosis not present

## 2024-03-30 DIAGNOSIS — K219 Gastro-esophageal reflux disease without esophagitis: Secondary | ICD-10-CM

## 2024-03-30 DIAGNOSIS — J309 Allergic rhinitis, unspecified: Secondary | ICD-10-CM

## 2024-03-30 MED ORDER — ALBUTEROL SULFATE HFA 108 (90 BASE) MCG/ACT IN AERS: 2.0000 | INHALATION_SPRAY | Freq: Four times a day (QID) | RESPIRATORY_TRACT | 2 refills | Status: AC | PRN

## 2024-03-30 MED ORDER — BENZONATATE 200 MG PO CAPS: 200.0000 mg | ORAL_CAPSULE | Freq: Three times a day (TID) | ORAL | 1 refills | Status: AC | PRN

## 2024-03-30 MED ORDER — MOMETASONE FURO-FORMOTEROL FUM 100-5 MCG/ACT IN AERO: 2.0000 | INHALATION_SPRAY | Freq: Two times a day (BID) | RESPIRATORY_TRACT | 3 refills | Status: AC

## 2024-03-30 MED ORDER — PREDNISONE 20 MG PO TABS: 20.0000 mg | ORAL_TABLET | Freq: Every day | ORAL | 0 refills | Status: AC

## 2024-03-30 NOTE — Telephone Encounter (Signed)
 Called on-call service. Advair  not covered by insurance. Changed to Dulera 100-101mcg/act two puffs twice daily and sent in Rx for Albuterol  to have on hand for rescue

## 2024-03-30 NOTE — Progress Notes (Signed)
 Virtual Visit via Video Note  I connected with Alexander Mclean on 03/30/24 at  2:30 PM EST by a video enabled telemedicine application and verified that I am speaking with the correct person using two identifiers.  Location: Patient: Home  Provider: Office    I discussed the limitations of evaluation and management by telemedicine and the availability of in person appointments. The patient expressed understanding and agreed to proceed.  History of Present Illness: 59 yo male never smoker followed for Asthma and Chronic cough   Today's video visit a working visit for asthma flare.  Patient was last seen in the office in 2023 a former patient of Dr. Meade.  Patient complains over the last 6 months that he has had worsening of a daily productive cough.  Approximately 2 months ago he had influenza and since then cough has been much worse with daily productive cough with thick mucus. He was seen by his primary care provider diagnosed with bronchitis and treated with antibiotics.  Patient said he had some improvement but not resolution of his cough.  He also was found to have possible GERD component and was recommended to begin pantoprazole daily.  Patient is currently using Advair  2 puffs daily in the morning.  He is on Allegra most days.  Does have some postnasal drainage and nasal congestion.  Denies any significant wheezing.  No hemoptysis.  Patient is a never smoker.  He does not have any pets at home.  He also works from home. Chest x-ray done in December 2025 showed clear lungs.  Observations/Objective: Appears in no acute distress.  Assessment and Plan: Mild persistent asthma with recent slow to resolve exacerbation after influenza.-Recommend increasing Advair  to 2 puffs twice daily.  Short prednisone  burst with prednisone  20 mg daily for 5 days albuterol  as needed.  Return to the office in 3 to 4 weeks with PFTs.  Consider allergy testing with allergy panel, IgE and CBC with  differential  Chronic cough-ongoing symptoms for the last 6 months with worsening after influenza in November 2025.  Recommend cough suppression regimen with Delsym and Tessalon Perles.  Continue on possible GERD component with pantoprazole daily,  add in Pepcid 20 mg at bedtime. Consider HRCT chest if ongoing on return visit   Plan  Patient Instructions  Increase Advair  2 puffs Twice daily , rinse after use  Prednisone  20mg  daily 5 days, take with food  Continue on Allegra daily  Continue on Protonix daily  Add Pepcid 20mg  At bedtime   Add Delsym 2 tsp Twice daily for cough As needed   Add Tessalon Three times a day  for cough As needed   Follow up in 4 weeks with PFTs with Dr. Theodoro or Jaqwon Manfred NP and As needed   Please contact office for sooner follow up if symptoms do not improve or worsen or seek emergency care      Follow Up Instructions:    I discussed the assessment and treatment plan with the patient. The patient was provided an opportunity to ask questions and all were answered. The patient agreed with the plan and demonstrated an understanding of the instructions.   The patient was advised to call back or seek an in-person evaluation if the symptoms worsen or if the condition fails to improve as anticipated.  I provided 30  minutes of non-face-to-face time during this encounter.   Madelin Stank, NP

## 2024-03-30 NOTE — Telephone Encounter (Signed)
 Copied from CRM #[id number]. Topic: Clinical - Prescription Issue >> Mar 29, 2024  4:23 PM Rilla B wrote: Reason for CRM: Patient Patient stated he went to pick up Advair  and it was not a pharmacy. Patient states he did set up appt  with Tammy Parrett tomorrow @ 2:30p. Please call patient @ [phone]   Spoke with patient requested that he keep his appointment today for his refills

## 2024-03-30 NOTE — Patient Instructions (Addendum)
 Increase Advair  2 puffs Twice daily , rinse after use  Prednisone  20mg  daily 5 days, take with food  Continue on Allegra daily  Continue on Protonix daily  Add Pepcid 20mg  At bedtime   Add Delsym 2 tsp Twice daily for cough As needed   Add Tessalon Three times a day  for cough As needed   Follow up in 4 weeks with PFTs with Dr. Theodoro or Alexander Brotzman NP and As needed   Please contact office for sooner follow up if symptoms do not improve or worsen or seek emergency care

## 2024-04-06 ENCOUNTER — Telehealth: Payer: Self-pay

## 2024-04-06 MED ORDER — FLUTICASONE-SALMETEROL 115-21 MCG/ACT IN AERO: 1.0000 | INHALATION_SPRAY | Freq: Two times a day (BID) | RESPIRATORY_TRACT | 12 refills | Status: AC

## 2024-04-06 NOTE — Telephone Encounter (Signed)
 Received fax requesting refill on fluticasone /salm 115/21 mcg inh 120s (Generic for Advair  HFA 115/89mcg)  OK per Dr. Jovita to change back from Dulera (see telephone note from 03/30/2024).  Sent in rx.  Also faxed in paper rx signed by Dr. Adrien received from pharmacy.
